# Patient Record
Sex: Male | Born: 1935 | Race: White | Hispanic: No | Marital: Married | Smoking: Former smoker
Health system: Southern US, Community
[De-identification: ages and names within clinical notes are randomized; demographics above are authoritative.]

## PROBLEM LIST (undated history)

## (undated) DIAGNOSIS — C649 Malignant neoplasm of unspecified kidney, except renal pelvis: Secondary | ICD-10-CM

## (undated) DIAGNOSIS — M5030 Other cervical disc degeneration, unspecified cervical region: Secondary | ICD-10-CM

## (undated) DIAGNOSIS — M5136 Other intervertebral disc degeneration, lumbar region: Secondary | ICD-10-CM

## (undated) DIAGNOSIS — F329 Major depressive disorder, single episode, unspecified: Secondary | ICD-10-CM

## (undated) DIAGNOSIS — M51369 Other intervertebral disc degeneration, lumbar region without mention of lumbar back pain or lower extremity pain: Secondary | ICD-10-CM

## (undated) DIAGNOSIS — F32A Depression, unspecified: Secondary | ICD-10-CM

## (undated) DIAGNOSIS — Z8739 Personal history of other diseases of the musculoskeletal system and connective tissue: Secondary | ICD-10-CM

## (undated) DIAGNOSIS — K219 Gastro-esophageal reflux disease without esophagitis: Secondary | ICD-10-CM

## (undated) DIAGNOSIS — I10 Essential (primary) hypertension: Secondary | ICD-10-CM

## (undated) DIAGNOSIS — R918 Other nonspecific abnormal finding of lung field: Secondary | ICD-10-CM

## (undated) DIAGNOSIS — G20 Parkinson's disease: Secondary | ICD-10-CM

## (undated) DIAGNOSIS — N289 Disorder of kidney and ureter, unspecified: Secondary | ICD-10-CM

## (undated) DIAGNOSIS — M1990 Unspecified osteoarthritis, unspecified site: Secondary | ICD-10-CM

## (undated) DIAGNOSIS — G20C Parkinsonism, unspecified: Secondary | ICD-10-CM

## (undated) DIAGNOSIS — N400 Enlarged prostate without lower urinary tract symptoms: Secondary | ICD-10-CM

## (undated) HISTORY — DX: Major depressive disorder, single episode, unspecified: F32.9

## (undated) HISTORY — DX: Essential (primary) hypertension: I10

## (undated) HISTORY — DX: Disorder of kidney and ureter, unspecified: N28.9

## (undated) HISTORY — DX: Parkinson's disease: G20

## (undated) HISTORY — DX: Malignant neoplasm of unspecified kidney, except renal pelvis: C64.9

## (undated) HISTORY — PX: NEPHRECTOMY: SHX65

## (undated) HISTORY — DX: Parkinsonism, unspecified: G20.C

## (undated) HISTORY — DX: Benign prostatic hyperplasia without lower urinary tract symptoms: N40.0

## (undated) HISTORY — DX: Gastro-esophageal reflux disease without esophagitis: K21.9

## (undated) HISTORY — DX: Other intervertebral disc degeneration, lumbar region without mention of lumbar back pain or lower extremity pain: M51.369

## (undated) HISTORY — DX: Unspecified osteoarthritis, unspecified site: M19.90

## (undated) HISTORY — DX: Other cervical disc degeneration, unspecified cervical region: M50.30

## (undated) HISTORY — DX: Other nonspecific abnormal finding of lung field: R91.8

## (undated) HISTORY — PX: CERVICAL LAMINECTOMY: SHX94

## (undated) HISTORY — PX: TOTAL HIP ARTHROPLASTY: SHX124

## (undated) HISTORY — DX: Personal history of other diseases of the musculoskeletal system and connective tissue: Z87.39

## (undated) HISTORY — DX: Depression, unspecified: F32.A

## (undated) HISTORY — DX: Other intervertebral disc degeneration, lumbar region: M51.36

---

## 1997-10-25 ENCOUNTER — Ambulatory Visit (HOSPITAL_COMMUNITY): Admission: RE | Admit: 1997-10-25 | Discharge: 1997-10-25 | Payer: Self-pay | Admitting: Neurosurgery

## 1997-11-04 ENCOUNTER — Ambulatory Visit (HOSPITAL_COMMUNITY): Admission: RE | Admit: 1997-11-04 | Discharge: 1997-11-04 | Payer: Self-pay | Admitting: Neurosurgery

## 1999-04-29 ENCOUNTER — Encounter: Payer: Self-pay | Admitting: Neurosurgery

## 1999-04-29 ENCOUNTER — Ambulatory Visit (HOSPITAL_COMMUNITY): Admission: RE | Admit: 1999-04-29 | Discharge: 1999-04-29 | Payer: Self-pay | Admitting: Neurosurgery

## 1999-06-12 ENCOUNTER — Encounter: Payer: Self-pay | Admitting: Orthopedic Surgery

## 1999-06-12 ENCOUNTER — Ambulatory Visit (HOSPITAL_COMMUNITY): Admission: RE | Admit: 1999-06-12 | Discharge: 1999-06-12 | Payer: Self-pay | Admitting: Orthopedic Surgery

## 2008-07-02 DIAGNOSIS — C649 Malignant neoplasm of unspecified kidney, except renal pelvis: Secondary | ICD-10-CM

## 2008-07-02 HISTORY — DX: Malignant neoplasm of unspecified kidney, except renal pelvis: C64.9

## 2009-06-20 ENCOUNTER — Encounter (INDEPENDENT_AMBULATORY_CARE_PROVIDER_SITE_OTHER): Payer: Self-pay | Admitting: Urology

## 2009-06-20 ENCOUNTER — Inpatient Hospital Stay (HOSPITAL_COMMUNITY): Admission: RE | Admit: 2009-06-20 | Discharge: 2009-06-23 | Payer: Self-pay | Admitting: Urology

## 2009-06-23 ENCOUNTER — Emergency Department (HOSPITAL_COMMUNITY): Admission: EM | Admit: 2009-06-23 | Discharge: 2009-06-24 | Payer: Self-pay | Admitting: Emergency Medicine

## 2010-10-02 LAB — BASIC METABOLIC PANEL
BUN: 24 mg/dL — ABNORMAL HIGH (ref 6–23)
CO2: 25 mEq/L (ref 19–32)
CO2: 29 mEq/L (ref 19–32)
Calcium: 8.3 mg/dL — ABNORMAL LOW (ref 8.4–10.5)
Calcium: 9.1 mg/dL (ref 8.4–10.5)
Chloride: 100 mEq/L (ref 96–112)
Chloride: 104 mEq/L (ref 96–112)
Chloride: 105 mEq/L (ref 96–112)
Chloride: 107 mEq/L (ref 96–112)
Creatinine, Ser: 1.21 mg/dL (ref 0.4–1.5)
Creatinine, Ser: 1.29 mg/dL (ref 0.4–1.5)
GFR calc Af Amer: 43 mL/min — ABNORMAL LOW (ref >60)
GFR calc Af Amer: 44 mL/min — ABNORMAL LOW (ref >60)
GFR calc Af Amer: 48 mL/min — ABNORMAL LOW (ref >60)
GFR calc Af Amer: >60 mL/min (ref >60)
GFR calc Af Amer: >60 mL/min (ref >60)
GFR calc non Af Amer: 35 mL/min — ABNORMAL LOW (ref >60)
GFR calc non Af Amer: 36 mL/min — ABNORMAL LOW (ref >60)
GFR calc non Af Amer: 58 mL/min — ABNORMAL LOW (ref >60)
Glucose, Bld: 99 mg/dL (ref 70–99)
Potassium: 3.8 mEq/L (ref 3.5–5.1)
Potassium: 4 mEq/L (ref 3.5–5.1)
Potassium: 4.2 mEq/L (ref 3.5–5.1)
Potassium: 4.3 mEq/L (ref 3.5–5.1)
Potassium: 4.8 mEq/L (ref 3.5–5.1)
Sodium: 133 mEq/L — ABNORMAL LOW (ref 135–145)
Sodium: 133 mEq/L — ABNORMAL LOW (ref 135–145)
Sodium: 136 mEq/L (ref 135–145)
Sodium: 141 mEq/L (ref 135–145)

## 2010-10-02 LAB — URINALYSIS, ROUTINE W REFLEX MICROSCOPIC
Leukocytes, UA: NEGATIVE
Protein, ur: NEGATIVE mg/dL
Specific Gravity, Urine: 1.008 (ref 1.005–1.030)
Urobilinogen, UA: 0.2 mg/dL (ref 0.0–1.0)

## 2010-10-02 LAB — CBC
HCT: 34.8 % — ABNORMAL LOW (ref 39.0–52.0)
Hemoglobin: 11.5 g/dL — ABNORMAL LOW (ref 13.0–17.0)
MCHC: 33 g/dL (ref 30.0–36.0)
MCV: 97.1 fL (ref 78.0–100.0)
Platelets: 213 10*3/uL (ref 150–400)
RBC: 3.58 MIL/uL — ABNORMAL LOW (ref 4.22–5.81)
RDW: 14 % (ref 11.5–15.5)
WBC: 7.3 10*3/uL (ref 4.0–10.5)

## 2010-10-02 LAB — POCT I-STAT, CHEM 8
BUN: 17 mg/dL (ref 6–23)
Creatinine, Ser: 2.1 mg/dL — ABNORMAL HIGH (ref 0.4–1.5)
Hemoglobin: 10.2 g/dL — ABNORMAL LOW (ref 13.0–17.0)
Potassium: 4 mEq/L (ref 3.5–5.1)
Sodium: 134 mEq/L — ABNORMAL LOW (ref 135–145)

## 2010-10-02 LAB — HEMOGLOBIN AND HEMATOCRIT, BLOOD
HCT: 30.8 % — ABNORMAL LOW (ref 39.0–52.0)
HCT: 31.5 % — ABNORMAL LOW (ref 39.0–52.0)
Hemoglobin: 10.4 g/dL — ABNORMAL LOW (ref 13.0–17.0)

## 2010-10-02 LAB — TYPE AND SCREEN
ABO/RH(D): O POS
Antibody Screen: NEGATIVE

## 2010-10-02 LAB — URINE MICROSCOPIC-ADD ON

## 2013-07-02 HISTORY — PX: CATARACT EXTRACTION, BILATERAL: SHX1313

## 2014-07-28 ENCOUNTER — Telehealth: Payer: Self-pay | Admitting: Oncology

## 2014-07-28 NOTE — Telephone Encounter (Signed)
EMAIL NP APPT DTR [facility] FOR 05/12 @ 4 W/DR. MAGRINAT.  MEDICAL RECORDS RECEIVED DROM [facility]

## 2014-09-29 ENCOUNTER — Encounter: Payer: Self-pay | Admitting: Neurology

## 2014-11-04 ENCOUNTER — Ambulatory Visit (INDEPENDENT_AMBULATORY_CARE_PROVIDER_SITE_OTHER): Payer: Medicare Other | Admitting: Neurology

## 2014-11-04 ENCOUNTER — Encounter: Payer: Self-pay | Admitting: Neurology

## 2014-11-04 VITALS — BP 118/58 | HR 77 | Wt 142.0 lb

## 2014-11-04 DIAGNOSIS — N289 Disorder of kidney and ureter, unspecified: Secondary | ICD-10-CM

## 2014-11-04 DIAGNOSIS — G249 Dystonia, unspecified: Secondary | ICD-10-CM | POA: Diagnosis not present

## 2014-11-04 DIAGNOSIS — K117 Disturbances of salivary secretion: Secondary | ICD-10-CM

## 2014-11-04 DIAGNOSIS — R911 Solitary pulmonary nodule: Secondary | ICD-10-CM

## 2014-11-04 DIAGNOSIS — F0280 Dementia in other diseases classified elsewhere without behavioral disturbance: Secondary | ICD-10-CM

## 2014-11-04 DIAGNOSIS — G20 Parkinson's disease: Secondary | ICD-10-CM

## 2014-11-04 NOTE — Progress Notes (Signed)
Richard Mcgee was seen today in the movement disorders clinic for neurologic consultation at the request of Dr. Darlen Round.  The consultation is for the evaluation of PD.  The records that were made available to me were reviewed.    Earliest records were from 2013, but it appears that the patient had seen him for longer than that.  This patient is accompanied in the office by his spouse and daughter in law who supplements the history.     The patient's first symptom that he can recall was right hand tremor.  Pts wife states he was initially dx with ET but he was later dx with PD.  They estimate that this was 5 years ago.  He was initially on no medication.  The patient has been on carbidopa/levodopa 25/100 since September, 2013.  In July, 2014 amantadine was added because of dyskinesia but by his follow-up visit in October, 2014 this medication had been discontinued.  Records do not make it clear why this was discontinued by the patient but the patients wife states that he had visual hallucinations.  In April 2015, Aricept was added but a subsequent note indicates that it was discontinued because of side effect, but does not indicate what the side effect was.  Pts wife/daughter in law states that he only took it once or twice but "he doesn't like to take medication."  [name] on carbidopa/levodopa 25/100, 2 at 6 am, 2 at noon, 2 at 5 pm.  He takes the noon dosage and the 5 pm dosage AFTER the meals.  Pt doesn't think that it helps but family reports that he tried to d/c a few weeks ago and sx's got worse (especially tremor).  The patient does not remember this.  The patient's wife does state that he was told by Dr. Darlen Round to take Benadryl as it could be helpful for Parkinson's disease.  The patient does not do this, but his wife wanted to know if it would be beneficial.  Specific Symptoms:  Tremor: Yes.   Voice: hypophonic Sleep:   Vivid Dreams:  No.  Acting out dreams:  Yes.   (has fallen out of bed and  broke clavicle last year) Wet Pillows: Yes.   Postural symptoms:  Yes.    Falls?  Yes.   (6 total falls since dx; last fall in middle of the night and fell in bathroom - over a month ago) Bradykinesia symptoms: slow movements and difficulty getting out of a chair (last therapy 07/2013) Loss of smell:  No. Loss of taste:  No. Urinary Incontinence:  Yes.   (but much better with nocturia now and not as many issues) Difficulty Swallowing:  No. (except very large pills) Handwriting, micrographia: Yes.   Trouble with ADL's:  Yes.   (has caregivers that help to bathe/dress - caregivers present x 3 years)  Trouble buttoning clothing: No. Depression:  Yes.   (to start Paxil XR) Memory changes:  Yes.   (drivers license expired on 5/68 and when went to renew license, the [facility] would not allow it; memory change worse if dehydrated; has a pill box and wife fills that) Hallucinations:  Yes.    visual distortions: Yes.   N/V:  Yes.   Lightheaded:  Yes.   (not often)  Syncope: No. Diplopia:  No. Dyskinesia:  Yes.   (hips will "gyrate"); has "tongue movement"  Neuroimaging has previously been performed.  Wife believes it was a CT brain done about 5 years ago.  It is not available for my review today.  PREVIOUS MEDICATIONS: Sinemet and Amantadine (hallucinations), given Aricept but did not take it because he does not "like pills."  ALLERGIES:   Allergies  Allergen Reactions  . Sulfa Antibiotics     CURRENT MEDICATIONS:  Outpatient Encounter Prescriptions as of 11/04/2014  Medication Sig  . acetaminophen-codeine (TYLENOL #3) 300-30 MG per tablet 1 tablet.  . carbidopa-levodopa (SINEMET IR) 25-100 MG per tablet Take 1 tablet by mouth 3 (three) times daily.  . Cholecalciferol 2000 UNITS CAPS Take 2,000 Units by mouth daily.  Marland Kitchen docusate sodium (COLACE) 50 MG capsule Take 50 mg by mouth 2 (two) times daily.  . Misc Natural Products (FIBER 7) POWD Take by mouth.  . Multiple Vitamin (MULTIVITAMIN) tablet  Take 1 tablet by mouth daily.  Marland Kitchen PARoxetine (PAXIL-CR) 12.5 MG 24 hr tablet Take 12.5 mg by mouth daily.  . polyethylene glycol (MIRALAX / GLYCOLAX) packet Take 17 g by mouth daily.  . [DISCONTINUED] amlodipine-benazepril (LOTREL) 2.5-10 MG per capsule Take 1 capsule by mouth daily.  . [DISCONTINUED] fluticasone (VERAMYST) 27.5 MCG/SPRAY nasal spray Place 2 sprays into the nose daily.  . [DISCONTINUED] omeprazole (PRILOSEC) 40 MG capsule Take 40 mg by mouth daily.  . [DISCONTINUED] cholecalciferol (VITAMIN D) 1000 UNITS tablet Take 2,000 Units by mouth daily.   No facility-administered encounter medications on file as of 11/04/2014.    PAST MEDICAL HISTORY:   Past Medical History  Diagnosis Date  . Hypertension   . GERD (gastroesophageal reflux disease)   . Parkinsonism   . Renal cell cancer 2010  . Degenerative disc disease, cervical   . Degenerative disc disease, lumbar   . Osteoarthritis   . Depression   . H/O rotator cuff tear   . BPH (benign prostatic hyperplasia)   . Renal insufficiency   . Lung nodules     PAST SURGICAL HISTORY:   Past Surgical History  Procedure Laterality Date  . Nephrectomy Right   . Total hip arthroplasty Bilateral   . Cervical laminectomy    . Cataract extraction, bilateral  2015    SOCIAL HISTORY:   History   Social History  . Marital Status: Married    Spouse Name: N/A  . Number of Children: N/A  . Years of Education: N/A   Occupational History  . 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 Smoking status: Former Research scientist (life sciences)  . Smokeless tobacco: Never Used     Comment: quit 1970's  . Alcohol Use: 0.0 oz/week    0 Standard drinks or equivalent per week     Comment: 1 time every 4 weeks  . Drug Use: No  . Sexual Activity: Not on file   Other Topics Concern  . Not on file   Social History Narrative   Lives with wife in a one story home.  Has 2  children.  Retired from [employer].      FAMILY HISTORY:   Family Status  Relation Status Death Age  . Mother Deceased     CAD, DM, CHF  . Father Deceased     coal mine accident  . Brother Deceased     CA, renal cell  . Sister Deceased     liver CA  . Child Alive     2, alive and well    ROS:  A complete 10 system review of systems was  obtained and was unremarkable apart from what is mentioned above.  The patient was seen in the office at approximately 10 AM and had last taken his medication at 6 AM  PHYSICAL EXAMINATION:    VITALS:   Filed Vitals:   11/04/14 0918  BP: 118/58  Pulse: 77  Weight: 142 lb (64.411 kg)  SpO2: 96%    GEN:  The patient appears stated age and is in NAD. HEENT:  Normocephalic, atraumatic.  The mucous membranes are moist. The superficial temporal arteries are without ropiness or tenderness. CV:  RRR Lungs:  CTAB Neck/HEME:  There are no carotid bruits bilaterally.  Neurological examination:  Orientation: The patient is alert and oriented to person and place, but had some difficulty with specifics on time.  He had difficulty with his history and required assistance from his daughter-in-law and wife. Sensation: Sensation is intact to light and pinprick throughout (facial, trunk, extremities). Vibration is decreased at the bilateral big toe, left greater than right. There is no extinction with double simultaneous stimulation. There is no sensory dermatomal level identified. Motor: Strength is 5/5 in the bilateral upper and lower extremities.   Shoulder shrug is equal and symmetric.  There is no pronator drift. Deep tendon reflexes: Deep tendon reflexes are 2/4 at the bilateral biceps, triceps, brachioradialis, patella and absent at the bilateral achilles. Plantar responses are downgoing bilaterally.  Movement examination: Tone: There is normal tone in the bilateral upper extremities.  The tone in the lower extremities is increased, although a  component of gegenhalten is likely present.  Abnormal movements: There is moderate to severe resting tremor bilaterally, but it is more persistent on the right than the left. Coordination:  There is  decremation with RAM's, seen with finger taps and alternating supination/pronation of the forearm. Gait and Station: The patient has significant difficulty arising out of a deep-seated chair without the use of the hands and is unable to do this at all.  He required assistance to get out of the chair.  He required assistance to walk down the hall, with the examiner support.  He froze when attempting turns and through doorways thresholds.  It is the right foot that tends to stick to the ground.  The patient's stride length is markedly decreased.    ASSESSMENT/PLAN:  1.  Idiopathic Parkinson's disease, diagnosed since approximately 2011 and starting on levodopa in September, 2013.  -The patient's disease course has been complicated by hallucinations, cognitive impairment and postural instability.  -The patient is not convinced that his medication necessarily helps, but his family thinks that it does.  He is currently on carbidopa/levodopa 25/100, 2 tablets 3 times per day.  After some discussion, we decided to do an on/off test.  I suspicion is that he may have levodopa resisted tremor, but that the levodopa likely helps rigidity, but the on/off test will help [place] decipher this.  -I talked to the patient and his family about the interaction between levodopa and protein and asked him to move his levodopa to at least 30 minutes before the meals, as opposed after the meals.  I may need to increase the number of pills that he takes, but wanted to wait until the on/off test.  Compliance has been somewhat of an issue with medication.  -Explained to them that I do not think Benadryl would be a good idea, as it can increase risk for falls and cognitive decline in this state of the disease.  -Talked to him about safety  and  community resources.  Much greater than 50% of this 65 minute visit was spent in counseling.  We talked about the [employer] gym, which I highly recommended for him.  We talked about the support group in [place] that meets once a month.  -I am going to refer him to the neuro rehabilitation center for PT/OT/ST. 2.  Sialorrhea  -Overall mild and while we discussed Myobloc, they are not interested right now. 3.  Parkinson's dementia  -He was on Aricept but has discontinued it.  He does not like to take medications.  Fortunately, he does have caregivers and safety has been addressed in the home. 4.  Renal insufficiency  -His daughter-in-law states that his last creatinine was 2.1.  This is being monitored by his PCP and urology. 5.  Lung nodule  -Per family, it is felt that this is likely a metastatic lesion from renal cell carcinoma and this is just being followed.  They state that it has been fairly stable. 6.  Dyskinesia  -Did not see any today.  Tried amantadine in the past but increased hallucinations.

## 2014-11-04 NOTE — Patient Instructions (Signed)
1. We have you scheduled for your on/off test on 11/12/2014 at 2:30 pm. Please arrive 15 minutes early. Please do not take your Parkinson's medications on this date. Take your last dose on 11/11/2014 (the evening dose). 2. You have been referred to Neuro Rehab. They will call you directly to schedule an appointment.  Please call [phone] if you do not hear from them.

## 2014-11-11 ENCOUNTER — Ambulatory Visit (HOSPITAL_COMMUNITY)
Admission: RE | Admit: 2014-11-11 | Discharge: 2014-11-11 | Disposition: A | Discharge status: Home / Self Care | Payer: Medicare Other | Source: Ambulatory Visit | Attending: Oncology | Admitting: Oncology

## 2014-11-11 ENCOUNTER — Ambulatory Visit: Payer: Medicare Other

## 2014-11-11 ENCOUNTER — Encounter: Payer: Self-pay | Admitting: Oncology

## 2014-11-11 ENCOUNTER — Ambulatory Visit (HOSPITAL_BASED_OUTPATIENT_CLINIC_OR_DEPARTMENT_OTHER): Payer: Medicare Other | Admitting: Oncology

## 2014-11-11 ENCOUNTER — Telehealth: Payer: Self-pay | Admitting: Neurology

## 2014-11-11 VITALS — BP 134/63 | HR 64 | Temp 97.5°F | Resp 18 | Ht 66.0 in | Wt 141.9 lb

## 2014-11-11 DIAGNOSIS — C7800 Secondary malignant neoplasm of unspecified lung: Secondary | ICD-10-CM

## 2014-11-11 DIAGNOSIS — C799 Secondary malignant neoplasm of unspecified site: Secondary | ICD-10-CM

## 2014-11-11 DIAGNOSIS — C641 Malignant neoplasm of right kidney, except renal pelvis: Secondary | ICD-10-CM | POA: Diagnosis present

## 2014-11-11 DIAGNOSIS — N183 Chronic kidney disease, stage 3 unspecified: Secondary | ICD-10-CM | POA: Insufficient documentation

## 2014-11-11 DIAGNOSIS — G20A1 Parkinson's disease without dyskinesia, without mention of fluctuations: Secondary | ICD-10-CM | POA: Insufficient documentation

## 2014-11-11 DIAGNOSIS — G20 Parkinson's disease: Secondary | ICD-10-CM

## 2014-11-11 DIAGNOSIS — C7970 Secondary malignant neoplasm of unspecified adrenal gland: Secondary | ICD-10-CM | POA: Diagnosis not present

## 2014-11-11 DIAGNOSIS — Z85528 Personal history of other malignant neoplasm of kidney: Secondary | ICD-10-CM | POA: Diagnosis not present

## 2014-11-11 NOTE — Telephone Encounter (Signed)
-----   Message from [place], [place] sent at 11/11/2014  2:17 PM EDT ----- Patients daughter called she was told that his appointment had been moved from 2:15 to 11:15 may not have care givers to come with at this time she would also like to follow up on a referral for OT and PT they have not received word on this

## 2014-11-11 NOTE — Progress Notes (Signed)
Checked in new pt with no financial concerns. °

## 2014-11-11 NOTE — Telephone Encounter (Signed)
Spoke with patient's daughter and they have worked out coming early for his appt. Called neuro rehab at [phone] to check on referral. They have received referral and will call patient with an appt.

## 2014-11-11 NOTE — Progress Notes (Signed)
[facility]  [phone] [phone]     ID: Richard Mcgee DOB: July 11, 1927  MR#: [id number]  XFG#:[id number]  Patient Care Team: Lajean Manes, MD as PCP - General (Internal Medicine) Raynelle Bring, MD as Consulting Physician (Urology) Chauncey Cruel, MD as Consulting Physician (Oncology) Ludwig Clarks, DO as Consulting Physician (Neurology) PCP: Mathews Argyle, MD OTHER MD:  CHIEF COMPLAINT: stage 4 clear cell renal carcinoma  CURRENT TREATMENT: observation   HISTORY OF PRESENT ILLNESS: Richard Mcgee") has a history of renal cell carcinoma dating back to 06/20/2009 when he underwent right nephrectomy under Dr. Alinda Money 4 clear cell renal carcinoma, grade 2, measuring 9 cm. The tumor was confined to the kidney and margins were negative SZB ( 2010-385). I do not have copies of any staging studies obtained at that time, but in September 2012 the patient was found to have bilateral lung lesions and a new 1.5 cm( right) adrenal mass. Treatment was discussed with the patient, but he chose observation, and over the last several years the largest measurable nodule, in the left lower lung, has increased there is slowly. By 07/28/2013 on CT scan it measured 2.2 cm. In August 2015 by chest x-ray it measured 3.2 cm, and when rechecked 08/21/2014 again by chest x-ray it had not changed at all, again measuring 3.2 cm.  His subsequent history is as detailed below.  INTERVAL HISTORY: Richard Mcgee was evaluated in the cancer clinic 11/11/2014 accompanied by her son Richard Mcgee. Richard Mcgee and his wife Richard Mcgee have moved to [place] recently to be closer to their 2 sons and grandchildren. He has established himself with a primary care physician, urologist, and neurologist.  REVIEW OF SYSTEMS: Antino has significant resting tremor and a shuffling gait consistent with his diagnosis of parkinsonism. This limits him. For instance he can no longer play the [place]. He used  to be an avid runner, skier, and Firefighter. He is now pretty much confined to the home, although he has been started on a special program of physical therapy for Parkinson's patients and he is enjoying that. He does have some pain in his lower back, hips and feet, and has a significant history of degenerative disc diseaseand spinal stenosis.Marland Kitchen He is status post bilateral hip replacement. He has had cervical laminectomy. He has also had some falls and is status post left clavicular fracture remotely. Sometimes his vision is a little bit blurred. He denies dizziness, nausea, or vomiting problems. He has a cough which is occasionally productive of white phlegm. There has been no hemoptysis and he denies shortness of breath or pleurisy. He has some stress urinary incontinence. He has a history of bladder infections and hematuria. He feels forgetful and somewhat depressed. A detailed review of systems today was otherwise noncontributory  PAST MEDICAL HISTORY: Past Medical History  Diagnosis Date  . Hypertension   . GERD (gastroesophageal reflux disease)   . Parkinsonism   . Renal cell cancer 2010  . Degenerative disc disease, cervical   . Degenerative disc disease, lumbar   . Osteoarthritis   . Depression   . H/O rotator cuff tear   . BPH (benign prostatic hyperplasia)   . Renal insufficiency   . Lung nodules     PAST SURGICAL HISTORY: Past Surgical History  Procedure Laterality Date  . Nephrectomy Right   . Total hip arthroplasty Bilateral   . Cervical laminectomy    . Cataract extraction, bilateral  2015    FAMILY HISTORY Family History  Problem Relation Age of Onset  . CAD    . Hypertension    . CVA    . Diabetes    . Kidney cancer    . Depression    the patient's father died at the age of 54, 32 years after minding accident. The patient's mother died at the age of 47 from complications of diabetes. The patient had one brother, who had kidney cancer as well as prostate cancer. He  had one sister who had liver cancer. Both of them were diagnosed in their 62s. The patient has a nephew diagnosed with kidney cancer in his 24s, and a cousin and A maternalaunt both diagnosed with kidney cancer in their 33s  SOCIAL HISTORY:  [name] used to work in [employer], and he was in [employer] when he met his wife of 60+ years, [name]. He was playing the plan at the [employer] and saw her and decided he was going to [place] her.Their 2 sons are [name], who is a Conservation officer, historic buildings here in [place], and [name], who is an attorney here in [place]. The patient has 2 grandchildren. He is not a church attender    ADVANCED DIRECTIVES: living Will is in place. The patient's wife is his healthcare power of attorney   HEALTH MAINTENANCE: History  Substance Use Topics  . Smoking status: Former Research scientist (life sciences)  . Smokeless tobacco: Never Used     Comment: quit 1970's  . Alcohol Use: 0.0 oz/week    0 Standard drinks or equivalent per week     Comment: 1 time every 4 weeks     Colonoscopy:  PSA:  Bone density:  Lipid panel:  Allergies  Allergen Reactions  . Sulfa Antibiotics     Current Outpatient Prescriptions  Medication Sig Dispense Refill  . acetaminophen-codeine (TYLENOL #3) 300-30 MG per tablet 1 tablet.    . carbidopa-levodopa (SINEMET IR) 25-100 MG per tablet Take 1 tablet by mouth 3 (three) times daily.    . Cholecalciferol 2000 UNITS CAPS Take 2,000 Units by mouth daily.    Marland Kitchen docusate sodium (COLACE) 50 MG capsule Take 50 mg by mouth 2 (two) times daily.    . Misc Natural Products (FIBER 7) POWD Take by mouth.    . Multiple Vitamin (MULTIVITAMIN) tablet Take 1 tablet by mouth daily.    Marland Kitchen PARoxetine (PAXIL-CR) 12.5 MG 24 hr tablet Take 12.5 mg by mouth daily.    . polyethylene glycol (MIRALAX / GLYCOLAX) packet Take 17 g by mouth daily.     No current facility-administered medications for this visit.    OBJECTIVE: older white man who appears younger than stated  age 81 Vitals:   11/11/14 1612  BP: 134/63  Pulse: 64  Temp: 97.5 F (36.4 C)  Resp: 18     Body mass index is 22.91 kg/(m^2).    ECOG FS:2 - Symptomatic, <50% confined to bed  Ocular: Sclerae unicteric, EOMs intact, pupils round and equal, Ear-nose-throat: Oropharynx clearand moist Lymphatic: No cervical or supraclavicular adenopathy Lungs no rales or rhonchi, fair excursion bilaterally Heart regular rate and rhythm, 2/6 mid systolic murmur, chronic Abd soft, nontender, positive bowel sounds, no masses palpated MSK no focal spinal tenderness, no joint edema Neuro: marked resting tremor, shuffling gait, minimal facial rigidity, well-oriented, positive affect    LAB RESULTS:  CMP     Component Value Date/Time   NA 134* 06/23/2009 2309   K 4.0 06/23/2009 2309   CL 104 06/23/2009 2309   CO2 26  06/23/2009 0450   GLUCOSE 102* 06/23/2009 2309   BUN 17 06/23/2009 2309   CREATININE 2.1* 06/23/2009 2309   CALCIUM 8.3* 06/23/2009 0450   GFRNONAA 35* 06/23/2009 0450   GFRAA * 06/23/2009 0450    43        The eGFR has been calculated using the MDRD equation. This calculation has not been validated in all clinical situations. eGFR's persistently <60 mL/min signify possible Chronic Kidney Disease.    INo results found for: SPEP, UPEP  Lab Results  Component Value Date   WBC 7.3 06/17/2009   HGB 10.2* 06/23/2009   HCT 30.0* 06/23/2009   MCV 97.1 06/17/2009   PLT 213 06/17/2009      Chemistry      Component Value Date/Time   NA 134* 06/23/2009 2309   K 4.0 06/23/2009 2309   CL 104 06/23/2009 2309   CO2 26 06/23/2009 0450   BUN 17 06/23/2009 2309   CREATININE 2.1* 06/23/2009 2309      Component Value Date/Time   CALCIUM 8.3* 06/23/2009 0450       No results found for: LABCA2  No components found for: [id number]  No results for input(s): INR in the last 168 hours.  Urinalysis    Component Value Date/Time   COLORURINE YELLOW 06/23/2009 2313    APPEARANCEUR CLEAR 06/23/2009 2313   LABSPEC 1.008 06/23/2009 2313   PHURINE 7.0 06/23/2009 2313   GLUCOSEU NEGATIVE 06/23/2009 2313   HGBUR TRACE* 06/23/2009 2313   BILIRUBINUR NEGATIVE 06/23/2009 2313   KETONESUR NEGATIVE 06/23/2009 2313   PROTEINUR NEGATIVE 06/23/2009 2313   UROBILINOGEN 0.2 06/23/2009 2313   NITRITE NEGATIVE 06/23/2009 2313   LEUKOCYTESUR NEGATIVE 06/23/2009 2313    STUDIES: Chest x-ray today pending  ASSESSMENT: 81 y.o. [place] man s/p Right nephrectomy 06/20/2009 for a T2a N0 MX, clear cell renal carcinoma  (1) progression in multiple lung lesions and a new adrenal lesion noted September 2012, followed off treatment since  (2) Parkinson's disease  (3) DDD with history of compression fractures but negative lumbar spine MRI 11/18/2012  (a) s/p cervical laminectomy  (b) s/p bilateral THR  (c) Hx trauma to Left clavicle (fall)  (d) spinal stenosis  (4) CKD stage 3  PLAN: I spent approximately an hour today with Richard Mcgee and his son Richard Mcgee going over his situation. We reviewed his diagnosis, treatment history, and prognosis. He is now 5-1/2 years out from his definitive surgery, with known metastatic disease, which however is growing very slowly.  The patient understands that we would not be able to cure his cancer even if we initiated aggressive treatment. The goal of treatment, if we started, would be control. The treatment we would start would be sunitinib. Today we discussed some of the mechanics on how to take this medication and also some of the possible toxicities, side effects and complications. This information was given to Richard Mcgee  in writing.  What he would like to do is keep an eye on things. If things start to "get out of hand", we could consider Sutent at that time. On the other hand if we wait "too long", he might not be able to tolerate that medication. In that case we would go straight to hospice, and the goal then would be not control of  the tumor but keeping him as fit as possible as long as possible and always comfortable.  It helps that Richard Mcgee and his wife have home instead caregivers daily. He is receiving excellent  care from his other physicians. Accordingly at this point my role is limited. We will obtain a chest x-ray today and one again in November, when he will return to see me. If we ever decide to try treatment, I would manage that. Otherwise I would be glad to participate in his hospice care if I when the time comes for that  The patient has a good understanding of the overall plan. he agrees with it. He or his familywill call with any problems that may develop before his next visit here.  Chauncey Cruel, MD   11/11/2014 5:34 PM Medical Oncology and Hematology [facility] [address] [place], [place] [address] Tel. [phone]    Fax. [phone]

## 2014-11-12 ENCOUNTER — Telehealth: Payer: Self-pay | Admitting: Neurology

## 2014-11-12 ENCOUNTER — Ambulatory Visit (INDEPENDENT_AMBULATORY_CARE_PROVIDER_SITE_OTHER): Payer: Medicare Other | Admitting: Neurology

## 2014-11-12 ENCOUNTER — Encounter: Payer: Self-pay | Admitting: Neurology

## 2014-11-12 VITALS — BP 156/70 | HR 68 | Ht 66.0 in | Wt 143.0 lb

## 2014-11-12 DIAGNOSIS — C641 Malignant neoplasm of right kidney, except renal pelvis: Secondary | ICD-10-CM | POA: Diagnosis not present

## 2014-11-12 DIAGNOSIS — C799 Secondary malignant neoplasm of unspecified site: Secondary | ICD-10-CM

## 2014-11-12 DIAGNOSIS — R441 Visual hallucinations: Secondary | ICD-10-CM | POA: Diagnosis not present

## 2014-11-12 DIAGNOSIS — F0280 Dementia in other diseases classified elsewhere without behavioral disturbance: Secondary | ICD-10-CM

## 2014-11-12 DIAGNOSIS — G20 Parkinson's disease: Secondary | ICD-10-CM

## 2014-11-12 DIAGNOSIS — G249 Dystonia, unspecified: Secondary | ICD-10-CM

## 2014-11-12 MED ORDER — CARBIDOPA-LEVODOPA 25-100 MG PO TABS
3.0000 | ORAL_TABLET | Freq: Once | ORAL | Status: AC
  Administered 2014-11-12: 3 via ORAL

## 2014-11-12 MED ORDER — CARBIDOPA-LEVODOPA 25-100 MG PO TABS: 2.0000 | ORAL_TABLET | Freq: Four times a day (QID) | ORAL | Status: DC

## 2014-11-12 NOTE — Progress Notes (Signed)
Richard Mcgee was seen today in the movement disorders clinic for neurologic consultation at the request of Dr. Darlen Round.  The consultation is for the evaluation of PD.  The records that were made available to me were reviewed.    Earliest records were from 2013, but it appears that the patient had seen him for longer than that.  This patient is accompanied in the office by his spouse and daughter in law who supplements the history.     The patient's first symptom that he can recall was right hand tremor.  Pts wife states he was initially dx with ET but he was later dx with PD.  They estimate that this was 5 years ago.  He was initially on no medication.  The patient has been on carbidopa/levodopa 25/100 since September, 2013.  In July, 2014 amantadine was added because of dyskinesia but by his follow-up visit in October, 2014 this medication had been discontinued.  Records do not make it clear why this was discontinued by the patient but the patients wife states that he had visual hallucinations.  In April 2015, Aricept was added but a subsequent note indicates that it was discontinued because of side effect, but does not indicate what the side effect was.  Pts wife/daughter in law states that he only took it once or twice but "he doesn't like to take medication."  [name] on carbidopa/levodopa 25/100, 2 at 6 am, 2 at noon, 2 at 5 pm.  He takes the noon dosage and the 5 pm dosage AFTER the meals.  Pt doesn't think that it helps but family reports that he tried to d/c a few weeks ago and sx's got worse (especially tremor).  The patient does not remember this.  The patient's wife does state that he was told by Dr. Darlen Round to take Benadryl as it could be helpful for Parkinson's disease.  The patient does not do this, but his wife wanted to know if it would be beneficial.  11/12/14 update:  The patient presents today for his on/off test.  He is accompanied by his daughter-in-law who supplements the history.  Since  our last visit, the patient has attended the [facility] gym and really has enjoyed it.  He is scheduled to start therapy on June 7.  He has not taken any of his levodopa since approximately 5:30 PM last night.  He has done worse this morning, but attributes that to the fact that he exercised vigorously and felt better yesterday.  He is currently generally taking his levodopa at 6 AM and then he will go back to sleep for 2 hours before he gets up for the day.  He then will take his next dose of medication at approximately noon and his last dose of medication at 5 PM.  Neuroimaging has previously been performed.  Wife believes it was a CT brain done about 5 years ago.   It is not available for my review today.  PREVIOUS MEDICATIONS: Sinemet and Amantadine (hallucinations), given Aricept but did not take it because he does not "like pills."  ALLERGIES:   Allergies  Allergen Reactions  . Sulfa Antibiotics     CURRENT MEDICATIONS:  Outpatient Encounter Prescriptions as of 11/12/2014  Medication Sig  . acetaminophen-codeine (TYLENOL #3) 300-30 MG per tablet 1 tablet.  . carbidopa-levodopa (SINEMET IR) 25-100 MG per tablet Take 1 tablet by mouth 3 (three) times daily.  . Cholecalciferol 2000 UNITS CAPS Take 2,000 Units by mouth daily.  Marland Kitchen  docusate sodium (COLACE) 50 MG capsule Take 50 mg by mouth 2 (two) times daily.  . Misc Natural Products (FIBER 7) POWD Take by mouth.  . Multiple Vitamin (MULTIVITAMIN) tablet Take 1 tablet by mouth daily.  Marland Kitchen PARoxetine (PAXIL-CR) 12.5 MG 24 hr tablet Take 12.5 mg by mouth daily.  . polyethylene glycol (MIRALAX / GLYCOLAX) packet Take 17 g by mouth daily.   No facility-administered encounter medications on file as of 11/12/2014.    PAST MEDICAL HISTORY:   Past Medical History  Diagnosis Date  . Hypertension   . GERD (gastroesophageal reflux disease)   . Parkinsonism   . Renal cell cancer 2010  . Degenerative disc disease, cervical   . Degenerative disc disease,  lumbar   . Osteoarthritis   . Depression   . H/O rotator cuff tear   . BPH (benign prostatic hyperplasia)   . Renal insufficiency   . Lung nodules     PAST SURGICAL HISTORY:   Past Surgical History  Procedure Laterality Date  . Nephrectomy Right   . Total hip arthroplasty Bilateral   . Cervical laminectomy    . Cataract extraction, bilateral  2015    SOCIAL HISTORY:   History   Social History  . Marital Status: Married    Spouse Name: N/A  . Number of Children: N/A  . Years of Education: N/A   Occupational History  . 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 Smoking status: Former Research scientist (life sciences)  . Smokeless tobacco: Never Used     Comment: quit 1970's  . Alcohol Use: 0.0 oz/week    0 Standard drinks or equivalent per week     Comment: 1 time every 4 weeks  . Drug Use: No  . Sexual Activity: Not on file   Other Topics Concern  . Not on file   Social History Narrative   Lives with wife in a one story home.  Has 2 children.  Retired from [employer].      FAMILY HISTORY:   Family Status  Relation Status Death Age  . Mother Deceased     CAD, DM, CHF  . Father Deceased     coal mine accident  . Brother Deceased     CA, renal cell  . Sister Deceased     liver CA  . Child Alive     2, alive and well    ROS:  A complete 10 system review of systems was obtained and was unremarkable apart from what is mentioned above.  The patient was seen in the office at approximately 10 AM and had last taken his medication at 6 AM  PHYSICAL EXAMINATION:    VITALS:   Filed Vitals:   11/12/14 1109  BP: 156/70  Pulse: 68  Height: 5\' 6"  (1.676 m)  Weight: 143 lb (64.864 kg)    GEN:  The patient appears stated age and is in NAD. HEENT:  Normocephalic, atraumatic.  The mucous membranes are moist. The superficial temporal arteries are without ropiness or tenderness. CV:   RRR Lungs:  CTAB Neck/HEME:  There are no carotid bruits bilaterally.  Neurological examination:  Orientation: The patient is alert and oriented to person and place, but had some difficulty with specifics on time.  He had difficulty with his history and required assistance from his daughter-in-law and wife. Sensation: Sensation is intact to light and pinprick throughout (facial,  trunk, extremities). Vibration is decreased at the bilateral big toe, left greater than right. There is no extinction with double simultaneous stimulation. There is no sensory dermatomal level identified. Motor: Strength is 5/5 in the bilateral upper and lower extremities.   Shoulder shrug is equal and symmetric.  There is no pronator drift. Deep tendon reflexes: Deep tendon reflexes are 2/4 at the bilateral biceps, triceps, brachioradialis, patella and absent at the bilateral achilles. Plantar responses are downgoing bilaterally.  Movement examination: A complete UPDRS motor on/off test was performed today.  This is documented on a separate neurophysiologic worksheet.  UPDRS motor off score was 44 and UPDRS motor on score was 32.  In brief, the patients tremor was levodopa nonresponsive, but the rigidity especially in the left upper extremity was levodopa responsive.  His gait did not particularly change with levodopa.  ASSESSMENT/PLAN:  1.  Idiopathic Parkinson's disease, diagnosed since approximately 2011 and starting on levodopa in September, 2013.  -The patient's disease course has been complicated by hallucinations, cognitive impairment and postural instability.  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  I did give him more levodopa than he generally gets at home and the patient would like to try an increased dose.  He is currently  taking his 2 tablets at 6 AM/12 PM/5 PM and so we decided to instead change it so he takes 2 tablets at 6 AM/10 AM/2 PM/6 PM.  He will let me know or his family will let me know if this causes an increase in hallucinations.  -I talked to the patient and his family again about the interaction between levodopa and protein and asked him to move his levodopa to at least 30 minutes before the meals.  He does have caregivers now.  -Explained to them that I do not think Benadryl would be a good idea, as it can increase risk for falls and cognitive decline in this state of the disease.  -I am happy to report that he is now attending the [facility] gym and I encouraged him to continue this, as compliance has been somewhat of an issue with the past with medications and programs.  He has a referral to the neuro rehabilitation center for PT/OT/ST and is scheduled to start on June 7. 2.  Sialorrhea  -Overall mild and while we discussed My[facility], they are not interested right now. 3.  Parkinson's dementia  -He was on Aricept but has discontinued it.  He does not like to take medications.  Fortunately, he does have caregivers and safety has been addressed in the home. 4.  Renal insufficiency  -His daughter-in-law states that his last creatinine was 2.1.  This is being monitored by his PCP and urology. 5.  Lung nodule  -I received and appreciate a note from his oncologist today.  He has metastatic renal cell that has been fairly slow to progress and they are just watching this. 6.  Dyskinesia  -Did not see any today.  Tried amantadine in the past but increased hallucinations.  This may become more evident since we increased his levodopa and we will need to watch for this. 7.  Return in about 3 months (around 02/12/2015).  Spent 60 min face to face with the patient with greater than 50% in counseling and coordinating care

## 2014-11-12 NOTE — Telephone Encounter (Signed)
Pt states that the [facility] phone number is [phone]

## 2014-11-12 NOTE — Patient Instructions (Signed)
1. Increase Carbidopa Levodopa 25/100 to 2 tablets at 6 am, 10 am, 2 pm and 6 pm. Prescription given.

## 2014-11-12 NOTE — Telephone Encounter (Signed)
RX sent to [facility] per their request.

## 2014-11-22 ENCOUNTER — Other Ambulatory Visit: Payer: Self-pay | Admitting: Oncology

## 2014-11-22 DIAGNOSIS — R918 Other nonspecific abnormal finding of lung field: Secondary | ICD-10-CM

## 2014-11-23 ENCOUNTER — Telehealth: Payer: Self-pay | Admitting: Oncology

## 2014-11-23 NOTE — Telephone Encounter (Signed)
s.w. pt wife and advised on OCT and NOV appt....pt ok and aware

## 2014-11-26 ENCOUNTER — Other Ambulatory Visit: Payer: Self-pay | Admitting: Oncology

## 2014-12-07 ENCOUNTER — Ambulatory Visit: Payer: Medicare Other | Attending: Neurology | Admitting: Physical Therapy

## 2014-12-07 DIAGNOSIS — R490 Dysphonia: Secondary | ICD-10-CM | POA: Diagnosis present

## 2014-12-07 DIAGNOSIS — R258 Other abnormal involuntary movements: Secondary | ICD-10-CM

## 2014-12-07 DIAGNOSIS — R293 Abnormal posture: Secondary | ICD-10-CM

## 2014-12-07 DIAGNOSIS — R2689 Other abnormalities of gait and mobility: Secondary | ICD-10-CM

## 2014-12-07 DIAGNOSIS — R269 Unspecified abnormalities of gait and mobility: Secondary | ICD-10-CM

## 2014-12-07 NOTE — Therapy (Signed)
[facility] [address] [place], [place], [address] Phone: [phone]   Fax:  [phone]  Physical Therapy Evaluation  Patient Details  Name: Rhea Thrun MRN: [id number] Date of Birth: 03-Apr-1928 Referring Provider:  Ludwig Clarks, DO  Encounter Date: 12/07/2014      PT End of Session - 12/07/14 1310    Visit Number 1   Number of Visits 17  eval + 16 visits   Date for PT Re-Evaluation 02/05/15   Authorization Type Medicare/Tricare-g-code every 10th visit   PT Start Time 1151   PT Stop Time 1236   PT Time Calculation (min) 45 min   Equipment Utilized During Treatment Gait belt   Activity Tolerance Patient tolerated treatment well   Behavior During Therapy [facility] for tasks assessed/performed      Past Medical History  Diagnosis Date  . Hypertension   . GERD (gastroesophageal reflux disease)   . Parkinsonism   . Renal cell cancer 2010  . Degenerative disc disease, cervical   . Degenerative disc disease, lumbar   . Osteoarthritis   . Depression   . H/O rotator cuff tear   . BPH (benign prostatic hyperplasia)   . Renal insufficiency   . Lung nodules     Past Surgical History  Procedure Laterality Date  . Nephrectomy Right   . Total hip arthroplasty Bilateral   . Cervical laminectomy    . Cataract extraction, bilateral  2015    There were no vitals filed for this visit.  Visit Diagnosis:  Abnormality of gait  Festinating gait  Postural instability  Bradykinesia      Subjective Assessment - 12/07/14 1155    Subjective [name] an 81 year old male who presents to OP PT with history of Parkinson's disease for at least 5 years.  He notes bilateral UE tremors, and has freezing episodes with gait with doorways, turns and crowded spaces.  He has had at least 3 falls in the past 6 months.  He uses 4-wheeled RW.  He uses cane at times when caregivers are present.   Patient is accompained by: Family member   wife and daughter-in-law   Patient Stated Goals Pt's goal for therapy is to decrease tremor in his hands.   Currently in Pain? Yes   Pain Score --  more numbness than pain   Pain Location Back  and legs   Pain Orientation Lower   Pain Descriptors / Indicators Aching   Pain Type Chronic pain  To see orthopedist for MRI results today   Aggravating Factors  unsure   Pain Relieving Factors better for an hour or two after going to gym; ice            [facility] PT Assessment - 12/07/14 0001    Assessment   Medical Diagnosis Parkinson's disease  Sinemet-resistant tremors   Precautions   Precautions Fall  status post bilateral hip replacements   Balance Screen   Has the patient fallen in the past 6 months Yes   How many times? 3   Has the patient had a decrease in activity level because of a fear of falling?  No   Is the patient reluctant to leave their home because of a fear of falling?  No   Home Environment   Living Environment Private residence   Living Arrangements Spouse/significant other  caregivers   Available Help at Discharge Family;Personal care attendant   Type of [place]  to enter   Entrance Stairs-Number of Steps 1  low step   Home Layout One level   Prior Function   Level of Independence Needs assistance with gait;Needs assistance with transfers   Leisure Goes to [facility] twice per week; caregivers walk with patient, uses theraband activities   ROM / Strength   AROM / PROM / Strength Strength   Strength   Overall Strength Comments grossly tested 3+ to 4/5 bilateral lower extremities   Transfers   Transfers Sit to Stand;Stand to Sit   Sit to Stand With upper extremity assist;From chair/3-in-1;4: Min assist  posterior lean   Sit to Stand Details (indicate cue type and reason) Pt has increased episodes of freezing with turning to sit.  cues for hand placement   Stand to Sit 4: Min assist   Ambulation/Gait   Ambulation/Gait Yes    Ambulation/Gait Assistance 4: Min guard   Ambulation Distance (Feet) 120 Feet   Assistive device 4-wheeled walker  cues for upright posture and incr. step length   Gait Pattern Step-through pattern;Decreased step length - right;Decreased step length - left;Decreased dorsiflexion - right;Decreased dorsiflexion - left;Festinating;Narrow base of support  forward flexed posture; freezing with turns   Ambulation Surface Level;Indoor   Gait velocity 14 sec=2.34 ft/sec   Standardized Balance Assessment   Standardized Balance Assessment Berg Balance Test;Timed Up and Go Test   Berg Balance Test   Sit to Stand Needs minimal aid to stand or to stabilize   Standing Unsupported Able to stand 2 minutes with supervision   Sitting with Back Unsupported but Feet Supported on Floor or Stool Able to sit safely and securely 2 minutes   Stand to Sit Uses backs of legs against chair to control descent   Transfers Needs one person to assist   Standing Unsupported with Eyes Closed Able to stand 10 seconds with supervision   Standing Ubsupported with Feet Together Needs help to attain position but able to stand for 30 seconds with feet together   From Standing, Reach Forward with Outstretched Arm Reaches forward but needs supervision   From Standing Position, Pick up Object from Floor Unable to try/needs assist to keep balance   From Standing Position, Turn to Look Behind Over each Shoulder Needs supervision when turning   Turn 360 Degrees Needs assistance while turning  1 minute to turn   Standing Unsupported, Alternately Place Feet on Step/Stool Needs assistance to keep from falling or unable to try   Standing Unsupported, One Foot in [employer] balance while stepping or standing   Standing on One Leg Unable to try or needs assist to prevent fall   Total Score 17   Timed Up and Go Test   TUG Normal TUG   Normal TUG (seconds) 57.94                           PT Education - 12/07/14 1309     Education provided Yes   Education Details turning technique, initiated discussion/practice for reducing freezing with gait and turns   Person(s) Educated Patient;Spouse  daughter-in-law   Methods Explanation;Demonstration   Comprehension Returned demonstration;Verbal cues required;Need further instruction          PT Short Term Goals - 12/07/14 1630    PT SHORT TERM GOAL #1   Title Pt will perform HEP with family/caregiver supervision, for improved transfers, gait, balance.  Target 01/06/15   Time 4   Period Weeks  Status New   PT SHORT TERM GOAL #2   Title Pt will perform at least 8 of 10 reps of sit<>stand transfers with minimal to no UE support for improved transfer efficiency.   Time 4   Period Weeks   Status New   PT SHORT TERM GOAL #3   Title Pt will improve TUG score to less than or equal to 45 seconds for decreased fall risk.   Time 4   Period Weeks   Status New   PT SHORT TERM GOAL #4   Title Pt/family/caregiver will verbalize understanding of techniques to reduce freezing with gait and turns.   Time 4   Period Weeks   Status New   PT SHORT TERM GOAL #5   Title Pt will improve Berg Balance score to at least 22/56 for decreased fall risk.   Time 4   Period Weeks   Status New           PT Long Term Goals - 12/07/14 1633    PT LONG TERM GOAL #1   Title Pt/family/caregiver will verbalize understanding of fall prevention within the home environment.  Target 02/05/15   Time 8   Period Weeks   Status New   PT LONG TERM GOAL #2   Title Pt will perform 360 turns in less than 30 seconds for improved turning efficiency and safety.   Time 8   Period Weeks   Status New   PT LONG TERM GOAL #3   Title Pt will improve TUG score to less than or equal to 30 seconds for decreased fall risk.   Time 8   Period Weeks   Status New   PT LONG TERM GOAL #4   Title Pt will improve Berg score to at least 27/56 for decreased fall risk.   Time 8   Period Weeks   Status New    PT LONG TERM GOAL #5   Title Pt will improve gait velocity to at least 2.62 ft/sec for improved gait efficiency and safety.   Time 8   Period Weeks   Status New               Plan - 12/07/14 1311    Clinical Impression Statement [name] an 81 year old male with history of Parkinson's disease, who recently moved from [place] to [place] to be with family.  Pt presents with bilateral UE tremors, decreased timing and coordination of gait, festinating and freezing episodes with gait and turns, decreased functional stregnth, bradykinesia, rigidity, postural instability, decreased upright posture.  [name] at fall risk per TUG score of 57.94 sec, and Berg score of 17/56.  Pt takes 1 minute to complete 360 degree turn.  Pt requires cues for safety due to leaving walker and walking towards chair to sit.  Pt would benefit from skilled PT to address functional strength and transfers, gait, balance and techniques to reduce freezing for overall improved functional mobility and decreased fall risk.   Pt will benefit from skilled therapeutic intervention in order to improve on the following deficits Abnormal gait;Decreased balance;Decreased mobility;Decreased safety awareness;Difficulty walking;Decreased strength;Impaired flexibility;Postural dysfunction   Rehab Potential Good   PT Frequency 2x / week   PT Duration 8 weeks  plus eval   PT Treatment/Interventions ADLs/Self Care Home Management;Gait training;DME Instruction;Functional mobility training;Therapeutic activities;Therapeutic exercise;Balance training;Neuromuscular re-education;Patient/family education   PT Next Visit Plan Review  and continue instruction/practice on gait and turning techniques to reduce freezing; instruct family and caregivers in  HEP   PT Home Exercise Plan try PWR! Moves in sitting, standing, possibly supine; work on [place] and transfers   Consulted and Agree with Plan of Care Patient;Family member/caregiver    Family Member Consulted wife and daughter-in-law          G-Codes - 12-31-14 1637    Functional Assessment Tool Used TUG 57.94 seconds; gait velocity 2.34 ft/sec, Berg 17/56   Functional Limitation Mobility: Walking and moving around   Mobility: Walking and Moving Around Current Status [phone]) At least 60 percent but less than 80 percent impaired, limited or restricted   Mobility: Walking and Moving Around Goal Status [phone]) At least 40 percent but less than 60 percent impaired, limited or restricted       Problem List Patient Active Problem List   Diagnosis Date Noted  . 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 Visual hallucinations 11/12/2014  . Renal cell carcinoma of right kidney metastatic to other site 11/11/2014  . Parkinson's disease (tremor, stiffness, slow motion, unstable posture) 11/11/2014  . CKD (chronic kidney disease), stage III 11/11/2014    Sarann Tregre W. 12-31-2014, 4:39 PM  Frazier Butt., PT  [facility] [address] [place], [place], [address] Phone: [phone]   Fax:  [phone]

## 2014-12-10 ENCOUNTER — Ambulatory Visit: Payer: Medicare Other | Admitting: Physical Therapy

## 2014-12-10 DIAGNOSIS — R269 Unspecified abnormalities of gait and mobility: Secondary | ICD-10-CM

## 2014-12-10 DIAGNOSIS — R2689 Other abnormalities of gait and mobility: Secondary | ICD-10-CM

## 2014-12-10 DIAGNOSIS — R490 Dysphonia: Secondary | ICD-10-CM | POA: Diagnosis not present

## 2014-12-10 DIAGNOSIS — R258 Other abnormal involuntary movements: Secondary | ICD-10-CM

## 2014-12-10 NOTE — Therapy (Signed)
[facility] [address] [place], [place], [address] Phone: [phone]   Fax:  [phone]  Physical Therapy Treatment  Patient Details  Name: Richard Mcgee MRN: [id number] Date of Birth: 04-26-1928 Referring Provider:  Lajean Manes, MD  Encounter Date: 12/10/2014      PT End of Session - 12/12/14 2048    Visit Number 2   Number of Visits 17   Date for PT Re-Evaluation 02/05/15   Authorization Type Medicare/Tricare-g-code every 10th visit   PT Start Time 0932   PT Stop Time 1015   PT Time Calculation (min) 43 min   Equipment Utilized During Treatment Gait belt   Activity Tolerance Patient tolerated treatment well   Behavior During Therapy [facility] for tasks assessed/performed      Past Medical History  Diagnosis Date  . Hypertension   . GERD (gastroesophageal reflux disease)   . Parkinsonism   . Renal cell cancer 2010  . Degenerative disc disease, cervical   . Degenerative disc disease, lumbar   . Osteoarthritis   . Depression   . H/O rotator cuff tear   . BPH (benign prostatic hyperplasia)   . Renal insufficiency   . Lung nodules     Past Surgical History  Procedure Laterality Date  . Nephrectomy Right   . Total hip arthroplasty Bilateral   . Cervical laminectomy    . Cataract extraction, bilateral  2015    There were no vitals filed for this visit.  Visit Diagnosis:  Abnormality of gait  Festinating gait  Bradykinesia      Subjective Assessment - 12/12/14 2042    Subjective No changes since earlier this week.  Richard Mcgee, caregiver, present with patient today.      Therapeutic Activity: -Practiced sit<>stand transfer technique, 10 reps, then 5 additional reps throughout session, with consistent verbal cues for proper technique.  Provided verbal cues to both patient and caregiver for proper technique and for carryover at home.  -Practiced weightshifting upon initial standing with initiation  of gait.  Practiced turning with 4-wheeled walker to sit at mat-using "U-turn technique" to decrease freezing with turns.   Gait: -Gait training with 4-wheeled RW, with min guard assistance, with cues for increased step length ("keeping feet under the seat"), with occasional stops and starts to reset posture or when step length began to decrease.  Pt ambulates throughout gym area, at least 150 ft, 4 reps.  Practiced gait through doorways, with cues for upright posture and to look at visual target.  Provided verbal cues to both patient and caregiver for proper technique and for carryover at home.                               PT Short Term Goals - 12/07/14 1630    PT SHORT TERM GOAL #1   Title Pt will perform HEP with family/caregiver supervision, for improved transfers, gait, balance.  Target 01/06/15   Time 4   Period Weeks   Status New   PT SHORT TERM GOAL #2   Title Pt will perform at least 8 of 10 reps of sit<>stand transfers with minimal to no UE support for improved transfer efficiency.   Time 4   Period Weeks   Status New   PT SHORT TERM GOAL #3   Title Pt will improve TUG score to less than or equal to 45 seconds for decreased fall risk.   Time 4  Period Weeks   Status New   PT SHORT TERM GOAL #4   Title Pt/family/caregiver will verbalize understanding of techniques to reduce freezing with gait and turns.   Time 4   Period Weeks   Status New   PT SHORT TERM GOAL #5   Title Pt will improve Berg Balance score to at least 22/56 for decreased fall risk.   Time 4   Period Weeks   Status New           PT Long Term Goals - 12/07/14 1633    PT LONG TERM GOAL #1   Title Pt/family/caregiver will verbalize understanding of fall prevention within the home environment.  Target 02/05/15   Time 8   Period Weeks   Status New   PT LONG TERM GOAL #2   Title Pt will perform 360 turns in less than 30 seconds for improved turning efficiency and safety.   Time 8    Period Weeks   Status New   PT LONG TERM GOAL #3   Title Pt will improve TUG score to less than or equal to 30 seconds for decreased fall risk.   Time 8   Period Weeks   Status New   PT LONG TERM GOAL #4   Title Pt will improve Berg score to at least 27/56 for decreased fall risk.   Time 8   Period Weeks   Status New   PT LONG TERM GOAL #5   Title Pt will improve gait velocity to at least 2.62 ft/sec for improved gait efficiency and safety.   Time 8   Period Weeks   Status New             Plan - 12/12/14 2049    Clinical Impression Statement Patient and caregiver note improvement in reduction of freezing episodes from instruction on eval day.  Caregiver present today for instruction in sit<>stand techniques and for techniques to reduce freezing with gait.  Pt appears to have less freezing episodes with gait during PT treatment sesison techniques.   Pt will benefit from skilled therapeutic intervention in order to improve on the following deficits Abnormal gait;Decreased balance;Decreased mobility;Decreased safety awareness;Difficulty walking;Decreased strength;Impaired flexibility;Postural dysfunction   Rehab Potential Good   PT Frequency 2x / week   PT Duration 8 weeks  plus eval   PT Treatment/Interventions ADLs/Self Care Home Management;Gait training;DME Instruction;Functional mobility training;Therapeutic activities;Therapeutic exercise;Balance training;Neuromuscular re-education;Patient/family education   PT Next Visit Plan Review  and continue instruction/practice on gait and turning techniques to reduce freezing; instruct family and caregivers in Richard Mcgee try PWR! Moves in sitting, standing, possibly supine; work on Richard Mcgee and transfers   Consulted and Agree with Plan of Care Patient;Family member/caregiver   Family Member Consulted wife and daughter-in-law        Problem List Patient Active Problem List   Diagnosis Date Noted  .  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 Visual hallucinations 11/12/2014  . Renal cell carcinoma of right kidney metastatic to other site 11/11/2014  . Parkinson's disease (tremor, stiffness, slow motion, unstable posture) 11/11/2014  . CKD (chronic kidney disease), stage III 11/11/2014    Richard Mcgee W. 12/12/2014, 8:53 PM  Frazier Butt., PT  [facility] [address] [place], [place], [address] Phone: [phone]   Fax:  [phone]

## 2014-12-10 NOTE — Patient Instructions (Signed)
Sit to Stand Transfers:  1. Scoot out to the edge of the chair 2. Place your feet flat on the floor, shoulder width apart.  Make sure your feet are tucked just under your knees. 3. Lean forward (nose over toes) with momentum, and stand up tall with your best posture.  If you need to use your arms, use them as a quick boost up to stand. 4. If you are in a low or soft chair, you can lean back and then forward up to stand, in order to get more momentum. 5. Once you are standing, make sure you are looking ahead and standing tall.  To sit down:  1. Back up until you feel the chair behind your legs. 2. Bend at you hips, reaching  Back for you chair, if needed, then slowly squat to sit down on your chair.     Tips to reduce freezing episodes with standing or walking:  1.  Stand tall with your feet wide, so that you can rock and weight shift through your hips. 2.  Don't try to fight the freeze: if you begin taking slower, faster, smaller steps, STOP, get your posture tall, and RESET your posture and balance.  Take a deep breath before taking the BIG step to start again. 3.  March in place, with high knee stepping, to get started walking again. 4.  Use auditory cues:  Count out loud, think of a familiar tune or song or cadence, use pocket metronome, to use rhythm to get started walking again. 5.  Use visual cues:  Use a line to step over, use laser pointer line to step over, (using BIG steps) to start walking again. 6. Use visual targets to keep your posture tall (look ahead and focus on an object or target at eye level). 7. As you approach where your destination with walking, count your steps out loud and/or focus on your target with your eyes until you are fully there. 8. Use appropriate assistive device, as advised by your physical therapist to assist with taking longer, consistent steps.

## 2014-12-16 ENCOUNTER — Ambulatory Visit: Payer: Medicare Other

## 2014-12-16 DIAGNOSIS — G20 Parkinson's disease: Secondary | ICD-10-CM

## 2014-12-16 DIAGNOSIS — R490 Dysphonia: Secondary | ICD-10-CM | POA: Diagnosis not present

## 2014-12-16 NOTE — Therapy (Signed)
[facility] [address] [place], [place], [address] Phone: [phone]   Fax:  [phone]  Speech Language Pathology Evaluation  Patient Details  Name: Richard Mcgee MRN: [id number] Date of Birth: [birth year] Referring Provider:  Lajean Manes, MD  Encounter Date: 12/16/2014      End of Session - 12/16/14 1229    Visit Number 1   Number of Visits 16   Date for SLP Re-Evaluation 02/14/15   SLP Start Time 1103   SLP Stop Time  1147   SLP Time Calculation (min) 44 min   Activity Tolerance Patient tolerated treatment well      Past Medical History  Diagnosis Date  . Hypertension   . GERD (gastroesophageal reflux disease)   . Parkinsonism   . Renal cell cancer 2010  . Degenerative disc disease, cervical   . Degenerative disc disease, lumbar   . Osteoarthritis   . Depression   . H/O rotator cuff tear   . BPH (benign prostatic hyperplasia)   . Renal insufficiency   . Lung nodules     Past Surgical History  Procedure Laterality Date  . Nephrectomy Right   . Total hip arthroplasty Bilateral   . Cervical laminectomy    . Cataract extraction, bilateral  2015    There were no vitals filed for this visit.  Visit Diagnosis: Hypokinetic Parkinsonian dysphonia      Subjective Assessment - 12/16/14 1119    Subjective Pt does not report what he feels as an increase in people asking him to repeat himself since his dx in 2011.   Patient is accompained by: --  caregiver            SLP Evaluation [facility] - 12/16/14 1121    SLP Visit Information   SLP Received On 12/16/14   Onset Date 2011   Medical Diagnosis Parkinson's Disease   Pain Assessment   Currently in Pain? Yes   Pain Score 7    Pain Location Back   Pain Orientation Lower   Pain Type Chronic pain   Pain Radiating Towards feet   General Information   Mobility Status used four-wheeled walker    Cognition   Overall Cognitive Status Within  Functional Limits for tasks assessed   Auditory Comprehension   Overall Auditory Comprehension Appears within functional limits for tasks assessed   Verbal Expression   Overall Verbal Expression Appears within functional limits for tasks assessed   Oral Motor/Sensory Function   Overall Oral Motor/Sensory Function Impaired   Labial Strength Reduced   Labial Coordination Reduced   Lingual ROM Within Functional Limits   Lingual Symmetry Within Functional Limits   Lingual Strength Reduced   Lingual Coordination Reduced   Facial Symmetry Within Functional Limits   Velum Within Functional Limits   Motor Speech   Overall Motor Speech Impaired   Phonation Low vocal intensity   Resonance Within functional limits   Intelligibility Intelligible      10 minutes of conversational speech was reduced today, at average 68dB (WNL= average 70-72dB) with range of 65-73dB, when a sound level meter was placed 30 cm away from pt's mouth. Overall intelligibility for this listener in a quiet environment was approx 100%. When ambient noise increased (radio turned on), pt's loudness did not improve. It is this SLP's opinion that pt is especially difficult to understand in environments with background noise. Production of loud /a/ averaged 82dB and usual verbal cues needed for loudness. Pt would benefit from skilled  ST in order to improve speech intelligibility and pt's QOL.          SLP Education - 01-09-2015 1228    Education provided Yes   Education Details loud /a/, nature of speech production in patients with parkinson's   Person(s) Educated Patient   Methods Explanation;Verbal cues;Demonstration   Comprehension Verbalized understanding;Returned demonstration          SLP Short Term Goals - 01/09/2015 1231    SLP SHORT TERM GOAL #1   Title pt will demo loud /a/ average 84dB over 4 visits   Time 4   Period Weeks   Status New   SLP SHORT TERM GOAL #2   Title pt will produce 18/20 sentences with  volume at or above 70dB   Time 4   Period Weeks   Status New   SLP SHORT TERM GOAL #3   Title pt will produce simple conversation for 5 minutes at 70dB with rare verbal cues   Time 4   Period Weeks   Status New          SLP Long Term Goals - 01/09/15 1233    SLP LONG TERM GOAL #1   Title pt will engage in 10 minutes mod complex conversation with volume at or above average 70dB   Time 8   Status New   SLP LONG TERM GOAL #2   Title pt will maintain loud /a/ at average 84dB over 6 sessions   Time 8   Period Weeks   Status New          Plan - 09-Jan-2015 1229    Clinical Impression Statement Pt presents with reduced intelligibility/dysarthria characterized by reduced volume which inhibits communicaiton with family and friends. Skilled ST is necessaryto improve volume and thus speech intelligibility, especially in background noise.Marland Kitchen   Speech Therapy Frequency 2x / week   Duration --  8 weeks   Treatment/Interventions SLP instruction and feedback;Compensatory strategies;Patient/family education;Functional tasks;Cueing hierarchy   Potential to Achieve Goals Good          G-Codes - 09-Jan-2015 1234    Functional Assessment Tool Used noms - 5 (approx 25%)   Functional Limitations Motor speech   Motor Speech Current Status [phone]) At least 20 percent but less than 40 percent impaired, limited or restricted   Motor Speech Goal Status (N4709) At least 1 percent but less than 20 percent impaired, limited or restricted      Problem List Patient Active Problem List   Diagnosis Date Noted  . 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 Visual hallucinations 11/12/2014  . Renal cell carcinoma of right kidney metastatic to other site 11/11/2014  . Parkinson's disease (tremor, stiffness, slow motion, unstable posture) 11/11/2014  . CKD (chronic kidney disease), stage III 11/11/2014    [facility] , MS, CCC-SLP  01/09/15, 12:36 PM  [facility] [address] [place], [place], [address] Phone: [phone]   Fax:  [phone]

## 2014-12-16 NOTE — Patient Instructions (Signed)
Complete 5 loud "ah" at home twice a day.

## 2014-12-17 ENCOUNTER — Ambulatory Visit: Payer: Medicare Other | Admitting: *Deleted

## 2014-12-22 ENCOUNTER — Ambulatory Visit: Payer: Medicare Other | Admitting: Physical Therapy

## 2014-12-22 DIAGNOSIS — R269 Unspecified abnormalities of gait and mobility: Secondary | ICD-10-CM

## 2014-12-22 DIAGNOSIS — R258 Other abnormal involuntary movements: Secondary | ICD-10-CM

## 2014-12-22 DIAGNOSIS — R490 Dysphonia: Secondary | ICD-10-CM | POA: Diagnosis not present

## 2014-12-22 NOTE — Therapy (Signed)
[facility] [address] [place], [place], [address] Phone: [phone]   Fax:  [phone]  Physical Therapy Treatment  Patient Details  Name: Richard Mcgee MRN: [id number] Date of Birth: 1927-10-11 Referring Provider:  Lajean Manes, MD  Encounter Date: 12/22/2014      PT End of Session - 12/22/14 2216    Visit Number 3   Number of Visits 17   Date for PT Re-Evaluation 02/05/15   Authorization Type Medicare/Tricare-g-code every 10th visit   PT Start Time 0935   PT Stop Time 1015   PT Time Calculation (min) 40 min   Equipment Utilized During Treatment Gait belt   Activity Tolerance Patient tolerated treatment well   Behavior During Therapy [facility] for tasks assessed/performed      Past Medical History  Diagnosis Date  . Hypertension   . GERD (gastroesophageal reflux disease)   . Parkinsonism   . Renal cell cancer 2010  . Degenerative disc disease, cervical   . Degenerative disc disease, lumbar   . Osteoarthritis   . Depression   . H/O rotator cuff tear   . BPH (benign prostatic hyperplasia)   . Renal insufficiency   . Lung nodules     Past Surgical History  Procedure Laterality Date  . Nephrectomy Right   . Total hip arthroplasty Bilateral   . Cervical laminectomy    . Cataract extraction, bilateral  2015    There were no vitals filed for this visit.  Visit Diagnosis:  Bradykinesia  Abnormality of gait      Subjective Assessment - 12/22/14 0935    Subjective No changes, no falls   Patient is accompained by: --  Caregiver-Emanuel   Currently in Pain? No/denies                         [facility] Adult PT Treatment/Exercise - 12/22/14 0001    Transfers   Transfers Sit to Stand;Stand to Sit   Sit to Stand With upper extremity assist;5: Supervision  from 20 inch mat surface   Sit to Stand Details (indicate cue type and reason) 10 reps   Stand to Sit 4: Min guard   Ambulation/Gait    Ambulation/Gait Yes   Ambulation/Gait Assistance 5: Supervision   Ambulation/Gait Assistance Details cues for posture; negotiated narrow spaces and furniture   Ambulation Distance (Feet) 240 Feet  then 350 ft   Assistive device 4-wheeled walker   Gait Pattern Step-through pattern;Decreased step length - left;Narrow base of support   Ambulation Surface Level;Indoor           PWR [facility]) - 12/22/14 5027    PWR! exercises Moves in sitting      PWR! Up for posture x 10, PWR! Step for transition/initial stepping x 10, in sitting position.  Due to pt's reporting pain in L shoulder with attempts at shoulder abduction and lifting (due to previous injury), pt performs axial mobility exercises for weightshifting x 10 reps, then for trunk rotation x 10 reps.  PT provides cues for deliberate movement patterns and provides cues to patient and caregiver for how each exercise relates to functional activities.       PT Education - 12/22/14 2215    Education provided Yes   Education Details HEP-seated PWR! Moves and axial mobility seated weightshifting and trunk rotation.   Person(s) Educated Patient;Caregiver(s)   Methods Explanation;Demonstration;Handout   Comprehension Verbalized understanding;Returned demonstration;Verbal cues required  PT Short Term Goals - 12/07/14 1630    PT SHORT TERM GOAL #1   Title Pt will perform HEP with family/caregiver supervision, for improved transfers, gait, balance.  Target 01/06/15   Time 4   Period Weeks   Status New   PT SHORT TERM GOAL #2   Title Pt will perform at least 8 of 10 reps of sit<>stand transfers with minimal to no UE support for improved transfer efficiency.   Time 4   Period Weeks   Status New   PT SHORT TERM GOAL #3   Title Pt will improve TUG score to less than or equal to 45 seconds for decreased fall risk.   Time 4   Period Weeks   Status New   PT SHORT TERM GOAL #4   Title Pt/family/caregiver will verbalize  understanding of techniques to reduce freezing with gait and turns.   Time 4   Period Weeks   Status New   PT SHORT TERM GOAL #5   Title Pt will improve Berg Balance score to at least 22/56 for decreased fall risk.   Time 4   Period Weeks   Status New           PT Long Term Goals - 12/07/14 1633    PT LONG TERM GOAL #1   Title Pt/family/caregiver will verbalize understanding of fall prevention within the home environment.  Target 02/05/15   Time 8   Period Weeks   Status New   PT LONG TERM GOAL #2   Title Pt will perform 360 turns in less than 30 seconds for improved turning efficiency and safety.   Time 8   Period Weeks   Status New   PT LONG TERM GOAL #3   Title Pt will improve TUG score to less than or equal to 30 seconds for decreased fall risk.   Time 8   Period Weeks   Status New   PT LONG TERM GOAL #4   Title Pt will improve Berg score to at least 27/56 for decreased fall risk.   Time 8   Period Weeks   Status New   PT LONG TERM GOAL #5   Title Pt will improve gait velocity to at least 2.62 ft/sec for improved gait efficiency and safety.   Time 8   Period Weeks   Status New               Plan - 12/22/14 2217    Clinical Impression Statement Pt continues to demonstrate improvement in functional mobility, though he continues to need cues initially for turning to sit and occasionally for posture with gait.  Pt not noted to have freezing episodes during gait in PT session today.  Pt limited in PWR! Moves with UE coordinated movements due to L shoulder injury from previous fall.  Pt will continue to benefit from further skilled PT to address functional strength and mobility, gait and balance.   Pt will benefit from skilled therapeutic intervention in order to improve on the following deficits Abnormal gait;Decreased balance;Decreased mobility;Decreased safety awareness;Difficulty walking;Decreased strength;Impaired flexibility;Postural dysfunction   Rehab  Potential Good   PT Frequency 2x / week   PT Duration 8 weeks  wk 2 of 8   PT Treatment/Interventions ADLs/Self Care Home Management;Gait training;DME Instruction;Functional mobility training;Therapeutic activities;Therapeutic exercise;Balance training;Neuromuscular re-education;Patient/family education   PT Next Visit Plan Review HEP for sitting exercises; add standing exercises and weightshifting   PT Home Exercise Plan try PWR! Moves in sitting, standing,  possibly supine; work on [place] and transfers   Consulted and Agree with Plan of Care Patient;Family member/caregiver   Family Member Consulted wife and daughter-in-law        Problem List Patient Active Problem List   Diagnosis Date Noted  . 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 Visual hallucinations 11/12/2014  . Renal cell carcinoma of right kidney metastatic to other site 11/11/2014  . Parkinson's disease (tremor, stiffness, slow motion, unstable posture) 11/11/2014  . CKD (chronic kidney disease), stage III 11/11/2014    Shaela Boer W. 12/22/2014, 10:24 PM Frazier Butt., PT [facility] [address] [place], [place], [address] Phone: [phone]   Fax:  [phone]

## 2014-12-22 NOTE — Patient Instructions (Signed)
-  Pt and caregiver provided handouts for PWR! Moves in sitting:  PWR! Up (focus on good posture and scapular squeezes) x 10, PWR! Step x 10  -Provided pictures for seated weigthshifting and trunk rotation as part of axial mobility program handouts

## 2014-12-28 ENCOUNTER — Encounter: Payer: Medicare Other | Admitting: Speech Pathology

## 2014-12-29 ENCOUNTER — Ambulatory Visit: Payer: Medicare Other | Admitting: Physical Therapy

## 2014-12-29 ENCOUNTER — Other Ambulatory Visit (HOSPITAL_COMMUNITY): Payer: Self-pay | Admitting: Geriatric Medicine

## 2014-12-29 DIAGNOSIS — R490 Dysphonia: Secondary | ICD-10-CM | POA: Diagnosis not present

## 2014-12-29 DIAGNOSIS — R2689 Other abnormalities of gait and mobility: Secondary | ICD-10-CM

## 2014-12-29 DIAGNOSIS — R011 Cardiac murmur, unspecified: Secondary | ICD-10-CM

## 2014-12-29 DIAGNOSIS — R258 Other abnormal involuntary movements: Secondary | ICD-10-CM

## 2014-12-29 DIAGNOSIS — R269 Unspecified abnormalities of gait and mobility: Secondary | ICD-10-CM

## 2014-12-29 NOTE — Patient Instructions (Signed)
Provided handout for PWR! Moves in Standing: -PWR! Up x 10 -PWR! Rock x 10, then rocking to lift opposite foot x 10 -PWR! Step to side x 10, then step forward x 10  Recommended UE support at chair, walker or counter, with supervision/assistance of caregiver -To be performed once per day

## 2014-12-29 NOTE — Therapy (Signed)
[facility] [address] [place], [place], [address] Phone: [phone]   Fax:  [phone]  Physical Therapy Treatment  Patient Details  Name: Richard Mcgee MRN: [id number] Date of Birth: 1927-07-24 Referring Provider:  Lajean Manes, MD  Encounter Date: 12/29/2014      PT End of Session - 12/29/14 1033    Visit Number 4   Number of Visits 17   Date for PT Re-Evaluation 02/05/15   Authorization Type Medicare/Tricare-g-code every 10th visit   PT Start Time 0934   PT Stop Time 1014   PT Time Calculation (min) 40 min   Equipment Utilized During Treatment Gait belt   Activity Tolerance Patient tolerated treatment well   Behavior During Therapy [facility] for tasks assessed/performed      Past Medical History  Diagnosis Date  . Hypertension   . GERD (gastroesophageal reflux disease)   . Parkinsonism   . Renal cell cancer 2010  . Degenerative disc disease, cervical   . Degenerative disc disease, lumbar   . Osteoarthritis   . Depression   . H/O rotator cuff tear   . BPH (benign prostatic hyperplasia)   . Renal insufficiency   . Lung nodules     Past Surgical History  Procedure Laterality Date  . Nephrectomy Right   . Total hip arthroplasty Bilateral   . Cervical laminectomy    . Cataract extraction, bilateral  2015    There were no vitals filed for this visit.  Visit Diagnosis:  Bradykinesia  Abnormality of gait  Festinating gait      Subjective Assessment - 12/29/14 0936    Subjective No changes since last visit; no falls   Patient is accompained by: --  Caregiver-Emanuel   Currently in Pain? Yes   Pain Score 6    Pain Location Back   Pain Orientation Lower   Pain Descriptors / Indicators Aching   Pain Type Chronic pain   Aggravating Factors  Bending over to brush teeth and during standing exercises   Pain Relieving Factors injections of cortisone helps at times                          [facility] Adult PT Treatment/Exercise - 12/29/14 1026    Transfers   Transfers Sit to Stand;Stand to Sit   Sit to Stand With upper extremity assist;5: Supervision   Sit to Stand Details (indicate cue type and reason) 5 reps   Stand to Sit 5: Supervision   Ambulation/Gait   Ambulation/Gait Yes   Ambulation/Gait Assistance 5: Supervision   Ambulation/Gait Assistance Details cues for posture; incorporated turning to sit using "U-turn" method with cues for marching for improved foot clearance   Ambulation Distance (Feet) 240 Feet  then 50 ft, then 100 ft   Assistive device 4-wheeled walker   Gait Pattern Step-through pattern;Decreased step length - left;Narrow base of support   Ambulation Surface Level;Indoor   Exercises   Exercises Knee/Hip   Knee/Hip Exercises: Aerobic   Stepper SciFit Seated Stepper, Level 2, 4 extremities x 10 minutes with RPM >75, for increased intensity           [facility]) - 12/29/14 0939    PWR! exercises Moves in sitting;Moves in standing   PWR! Up 10 reps  at chair, min guard assist   PWR! Rock 10 reps  2 sets, at chair, min guard assist   PWR Step 10 reps   Comments PT provides initial  instructional cues as well as min guard assistance;   PWR! Up 10   PWR! Rock 10  modifed-axial trunk mobility program   PWR! Twist 10  modified-axial trunk mobility program   PWR! Step 10   Comments Review of HEP-pt demonstrates understanding with visual and verbal cues; caregiver verbalizes understanding of HEP      PWR! Up performed for posture, PWR! Rock performed for [employer] and PWR! Step performed for improved transition stepping.  PT provides cues for increased intensity and large amplitude movement.       PT Education - 12/29/14 1032    Education provided Yes   Education Details HEP-standing PWR! moves (Up, Rock, [place], step side)   Person(s) Educated Patient;Caregiver(s)   Methods  Explanation;Demonstration;Handout;Verbal cues   Comprehension Verbalized understanding;Returned demonstration;Verbal cues required          PT Short Term Goals - 12/07/14 1630    PT SHORT TERM GOAL #1   Title Pt will perform HEP with family/caregiver supervision, for improved transfers, gait, balance.  Target 01/06/15   Time 4   Period Weeks   Status New   PT SHORT TERM GOAL #2   Title Pt will perform at least 8 of 10 reps of sit<>stand transfers with minimal to no UE support for improved transfer efficiency.   Time 4   Period Weeks   Status New   PT SHORT TERM GOAL #3   Title Pt will improve TUG score to less than or equal to 45 seconds for decreased fall risk.   Time 4   Period Weeks   Status New   PT SHORT TERM GOAL #4   Title Pt/family/caregiver will verbalize understanding of techniques to reduce freezing with gait and turns.   Time 4   Period Weeks   Status New   PT SHORT TERM GOAL #5   Title Pt will improve Berg Balance score to at least 22/56 for decreased fall risk.   Time 4   Period Weeks   Status New           PT Long Term Goals - 12/07/14 1633    PT LONG TERM GOAL #1   Title Pt/family/caregiver will verbalize understanding of fall prevention within the home environment.  Target 02/05/15   Time 8   Period Weeks   Status New   PT LONG TERM GOAL #2   Title Pt will perform 360 turns in less than 30 seconds for improved turning efficiency and safety.   Time 8   Period Weeks   Status New   PT LONG TERM GOAL #3   Title Pt will improve TUG score to less than or equal to 30 seconds for decreased fall risk.   Time 8   Period Weeks   Status New   PT LONG TERM GOAL #4   Title Pt will improve Berg score to at least 27/56 for decreased fall risk.   Time 8   Period Weeks   Status New   PT LONG TERM GOAL #5   Title Pt will improve gait velocity to at least 2.62 ft/sec for improved gait efficiency and safety.   Time 8   Period Weeks   Status New                Plan - 12/29/14 1034    Clinical Impression Statement Pt appears to be performing HEP with caregiver's assistance at home.  Pt performs standing exercises with UE and will benefit from their addition to  HEP.  Pt continues to respond well to cues for increased intensity of movement and larger amplitude movements.  Pt will continue to benefit from further skilled PT to address functional strength, mobility, gait and balance.   Pt will benefit from skilled therapeutic intervention in order to improve on the following deficits Abnormal gait;Decreased balance;Decreased mobility;Decreased safety awareness;Difficulty walking;Decreased strength;Impaired flexibility;Postural dysfunction   Rehab Potential Good   PT Frequency 2x / week   PT Duration 8 weeks  wk 3 of 8   PT Treatment/Interventions ADLs/Self Care Home Management;Gait training;DME Instruction;Functional mobility training;Therapeutic activities;Therapeutic exercise;Balance training;Neuromuscular re-education;Patient/family education   PT Next Visit Plan Review HEP for standing exercises; continue standing exercises for weightshifting; gait training activities   PT Home Exercise Plan try PWR! Moves in sitting, standing, possibly supine; work on [place] and transfers   Consulted and Agree with Plan of Care Patient;Family member/caregiver   Family Member Consulted caregiver-Emanuel        Problem List Patient Active Problem List   Diagnosis Date Noted  . 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 Visual hallucinations 11/12/2014  . Renal cell carcinoma of right kidney metastatic to other site 11/11/2014  . Parkinson's disease (tremor, stiffness, slow motion, unstable posture) 11/11/2014  . CKD (chronic kidney disease), stage III 11/11/2014    MARRIOTT,AMY W. 12/29/2014, 10:42 AM  Frazier Butt., PT  [facility] [address] [place], [place], [address] Phone: [phone]   Fax:  [phone]

## 2014-12-31 ENCOUNTER — Ambulatory Visit: Payer: Medicare Other | Attending: Neurology | Admitting: Physical Therapy

## 2014-12-31 DIAGNOSIS — R2689 Other abnormalities of gait and mobility: Secondary | ICD-10-CM | POA: Insufficient documentation

## 2014-12-31 DIAGNOSIS — R258 Other abnormal involuntary movements: Secondary | ICD-10-CM | POA: Insufficient documentation

## 2014-12-31 DIAGNOSIS — R269 Unspecified abnormalities of gait and mobility: Secondary | ICD-10-CM | POA: Insufficient documentation

## 2014-12-31 DIAGNOSIS — R293 Abnormal posture: Secondary | ICD-10-CM | POA: Insufficient documentation

## 2014-12-31 NOTE — Therapy (Signed)
[facility] [address] [place], [place], [address] Phone: [phone]   Fax:  [phone]  Physical Therapy Treatment  Patient Details  Name: Richard Mcgee MRN: [id number] Date of Birth: 1927-12-01 Referring Provider:  Lajean Manes, MD  Encounter Date: 12/31/2014      PT End of Session - 01/04/15 0829    Visit Number 5   Number of Visits 17   Date for PT Re-Evaluation 02/05/15   Authorization Type Medicare/Tricare-g-code every 10th visit   PT Start Time 0941   PT Stop Time 1019   PT Time Calculation (min) 38 min   Equipment Utilized During Treatment Gait belt   Activity Tolerance Patient tolerated treatment well   Behavior During Therapy [facility] for tasks assessed/performed      Past Medical History  Diagnosis Date  . Hypertension   . GERD (gastroesophageal reflux disease)   . Parkinsonism   . Renal cell cancer 2010  . Degenerative disc disease, cervical   . Degenerative disc disease, lumbar   . Osteoarthritis   . Depression   . H/O rotator cuff tear   . BPH (benign prostatic hyperplasia)   . Renal insufficiency   . Lung nodules     Past Surgical History  Procedure Laterality Date  . Nephrectomy Right   . Total hip arthroplasty Bilateral   . Cervical laminectomy    . Cataract extraction, bilateral  2015    There were no vitals filed for this visit.  Visit Diagnosis:  Abnormality of gait  Bradykinesia      Subjective Assessment - 01/04/15 0825    Subjective Pain coming and coming; almost fell at night a few nights ago   Patient is accompained by: --  Carlynn Spry   Currently in Pain? Yes   Pain Score 7    Pain Location Back   Pain Orientation Lower   Pain Descriptors / Indicators Aching   Pain Type Chronic pain   Aggravating Factors  bending over to brush teeth; during standing exercises   Pain Relieving Factors injections of cortisone help at times        Neuro Re-education: Pt  reviews/performs standing modified PWR! Moves x 10 reps each (given as HEP last visit).  [name] able to perform with bilateral UE support and superivision/cues.  In supine:  PWR! Moves:  PWR! Up for posture x 10 reps through shoulders, then through hips (bridging) x 10, PWR! Rock x 10, [place]! Twist (modified with hands together) with knees bent x 10, PWR! Step x 10, with assistance for R foot placement.  At end of session, pt ambulates 230 ft, 4-wheeled RW with min guard assistance, with cues for improved foot clearance, posture, heelstrike.  Pt performs start/stops during gait with brief rest breaks to reset posture.  Pt ambulates additional 150 ft. With 4-wheeled RW with min guard and verbal cues.  Sit<>stand x 5 reps, then U-turns to sit x 3 reps, with supervision and verbal cues.                          PT Short Term Goals - 12/07/14 1630    PT SHORT TERM GOAL #1   Title Pt will perform HEP with family/caregiver supervision, for improved transfers, gait, balance.  Target 01/06/15   Time 4   Period Weeks   Status New   PT SHORT TERM GOAL #2   Title Pt will perform at least 8 of 10  reps of sit<>stand transfers with minimal to no UE support for improved transfer efficiency.   Time 4   Period Weeks   Status New   PT SHORT TERM GOAL #3   Title Pt will improve TUG score to less than or equal to 45 seconds for decreased fall risk.   Time 4   Period Weeks   Status New   PT SHORT TERM GOAL #4   Title Pt/family/caregiver will verbalize understanding of techniques to reduce freezing with gait and turns.   Time 4   Period Weeks   Status New   PT SHORT TERM GOAL #5   Title Pt will improve Berg Balance score to at least 22/56 for decreased fall risk.   Time 4   Period Weeks   Status New           PT Long Term Goals - 12/07/14 1633    PT LONG TERM GOAL #1   Title Pt/family/caregiver will verbalize understanding of fall prevention within the home environment.  Target  02/05/15   Time 8   Period Weeks   Status New   PT LONG TERM GOAL #2   Title Pt will perform 360 turns in less than 30 seconds for improved turning efficiency and safety.   Time 8   Period Weeks   Status New   PT LONG TERM GOAL #3   Title Pt will improve TUG score to less than or equal to 30 seconds for decreased fall risk.   Time 8   Period Weeks   Status New   PT LONG TERM GOAL #4   Title Pt will improve Berg score to at least 27/56 for decreased fall risk.   Time 8   Period Weeks   Status New   PT LONG TERM GOAL #5   Title Pt will improve gait velocity to at least 2.62 ft/sec for improved gait efficiency and safety.   Time 8   Period Weeks   Status New               Plan - 01/04/15 0830    Clinical Impression Statement During supine exercises, pt requires tactile and verbal cues for increased intensity of movement.  Pt would benefit from further skilled PT for large amplitude movements in varied positions to further improve functional mobility and gait.   Pt will benefit from skilled therapeutic intervention in order to improve on the following deficits Abnormal gait;Decreased balance;Decreased mobility;Decreased safety awareness;Difficulty walking;Decreased strength;Impaired flexibility;Postural dysfunction   Rehab Potential Good   PT Frequency 2x / week   PT Duration 8 weeks  wk 3 of 8   PT Treatment/Interventions ADLs/Self Care Home Management;Gait training;DME Instruction;Functional mobility training;Therapeutic activities;Therapeutic exercise;Balance training;Neuromuscular re-education;Patient/family education   PT Next Visit Plan supine PWR! Moves, standing and gait activities; check STGs   Consulted and Agree with Plan of Care Patient;Family member/caregiver   Family Member Consulted caregiver-Emanuel        Problem List Patient Active Problem List   Diagnosis Date Noted  . 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 Visual hallucinations  11/12/2014  . Renal cell carcinoma of right kidney metastatic to other site 11/11/2014  . Parkinson's disease (tremor, stiffness, slow motion, unstable posture) 11/11/2014  . CKD (chronic kidney disease), stage III 11/11/2014    Darica Goren W. 01/04/2015, 8:33 AM  For treatment session 12/31/14  Frazier Butt., PT [facility] [address] [place], [place], [address] Phone: [phone]  Fax:  [phone]

## 2015-01-04 ENCOUNTER — Other Ambulatory Visit (HOSPITAL_COMMUNITY): Payer: Medicare Other

## 2015-01-05 ENCOUNTER — Ambulatory Visit: Payer: Medicare Other | Admitting: Physical Therapy

## 2015-01-07 ENCOUNTER — Ambulatory Visit: Payer: Medicare Other | Admitting: Physical Therapy

## 2015-01-07 DIAGNOSIS — R293 Abnormal posture: Secondary | ICD-10-CM

## 2015-01-07 DIAGNOSIS — R258 Other abnormal involuntary movements: Secondary | ICD-10-CM

## 2015-01-07 DIAGNOSIS — R269 Unspecified abnormalities of gait and mobility: Secondary | ICD-10-CM | POA: Diagnosis not present

## 2015-01-07 NOTE — Therapy (Signed)
[facility] [address] [place], [place], [address] Phone: [phone]   Fax:  [phone]  Physical Therapy Treatment  Patient Details  Name: Richard Mcgee MRN: [id number] Date of Birth: 11-Oct-1927 Referring Provider:  Lajean Manes, MD  Encounter Date: 01/07/2015      PT End of Session - 01/07/15 1216    Visit Number 6   Number of Visits 17   Date for PT Re-Evaluation 02/05/15   Authorization Type Medicare/Tricare-g-code every 10th visit   PT Start Time 0935   PT Stop Time 1014   PT Time Calculation (min) 39 min   Equipment Utilized During Treatment Gait belt   Activity Tolerance Patient tolerated treatment well   Behavior During Therapy [facility] for tasks assessed/performed      Past Medical History  Diagnosis Date  . Hypertension   . GERD (gastroesophageal reflux disease)   . Parkinsonism   . Renal cell cancer 2010  . Degenerative disc disease, cervical   . Degenerative disc disease, lumbar   . Osteoarthritis   . Depression   . H/O rotator cuff tear   . BPH (benign prostatic hyperplasia)   . Renal insufficiency   . Lung nodules     Past Surgical History  Procedure Laterality Date  . Nephrectomy Right   . Total hip arthroplasty Bilateral   . Cervical laminectomy    . Cataract extraction, bilateral  2015    There were no vitals filed for this visit.  Visit Diagnosis:  Abnormality of gait  Bradykinesia  Postural instability      Subjective Assessment - 01/07/15 0936    Subjective Pain is better due to getting injection in back on Wednesday.  Per patient, no restrictions    Currently in Pain? Yes   Pain Score 5    Pain Location Back   Pain Orientation Lower   Pain Descriptors / Indicators Aching   Pain Type Chronic pain   Aggravating Factors  bending over and standing too long aggravates pain   Pain Relieving Factors injections/sitting alleviate pain                          [facility] Adult PT Treatment/Exercise - 01/07/15 0951    Balance   Balance Assessed Yes   Standardized Balance Assessment   Standardized Balance Assessment Berg Balance Test;Timed Up and Go Test   Berg Balance Test   Sit to Stand Able to stand using hands after several tries   Standing Unsupported Able to stand safely 2 minutes   Sitting with Back Unsupported but Feet Supported on Floor or Stool Able to sit safely and securely 2 minutes   Stand to Sit Controls descent by using hands   Transfers Needs one person to assist   Standing Unsupported with Eyes Closed Able to stand 10 seconds with supervision   Standing Ubsupported with Feet Together Able to place feet together independently and stand 1 minute safely   From Standing, Reach Forward with Outstretched Arm Can reach forward >12 cm safely (5")   From Standing Position, Pick up Object from Floor Able to pick up shoe, needs supervision   From Standing Position, Turn to Look Behind Over each Shoulder Looks behind one side only/other side shows less weight shift  L>R   Turn 360 Degrees Needs assistance while turning  to L>28 seconds has to stop   Standing Unsupported, Alternately Place Feet on Step/Stool Able to complete >2 steps/needs minimal  assist   Standing Unsupported, One Foot in Front Able to take small step independently and hold 30 seconds   Standing on One Leg Tries to lift leg/unable to hold 3 seconds but remains standing independently   Total Score 34    Berg score improved from 17/56 to 34/56.  Remains at fall risk and recommended patient continue using his rollator walker.    Sit<>stand x 10 reps from 20 inch mat, then 18 inch chair 10 reps each, with min UE support, with several episodes of posterior lean.  Pt requires min guard assist and cues for increased forward lean.     Gait:  Gait training using rollator walker 400 ft, then 345 ft, then 200 ft, with start/stops, with cues for postural repositioning, with cues for  increased step length.  Pt requires supervision during gait.  With walking and preparing to sit, pt utilizes strategy of U-turn to sit, using rollator.  Pt requires cueing for this strategy as well as marching to improve foot clearance and decrease incidence of festinating/freezing during turn.        PT Short Term Goals - 01/07/15 7622    PT SHORT TERM GOAL #1   Title Pt will perform HEP with family/caregiver supervision, for improved transfers, gait, balance.  Target 01/06/15   Status Achieved   PT SHORT TERM GOAL #2   Title Pt will perform at least 8 of 10 reps of sit<>stand transfers with minimal to no UE support for improved transfer efficiency.   Baseline Requires min guard assistance and cues for momentum   Status Partially Met   PT SHORT TERM GOAL #3   Title Pt will improve TUG score to less than or equal to 45 seconds for decreased fall risk.   Baseline 01/07/15:  TUG 28.74 seconds   Status Achieved   PT SHORT TERM GOAL #4   Title Pt/family/caregiver will verbalize understanding of techniques to reduce freezing with gait and turns.   Baseline caregiver has been present during most treatment sessions for education/carryover   Status Achieved   PT SHORT TERM GOAL #5   Title Pt will improve Berg Balance score to at least 22/56 for decreased fall risk.   Baseline Berg 34/56 on 01/07/15   Status Achieved           PT Long Term Goals - 01/07/15 1217    PT LONG TERM GOAL #1   Title Pt/family/caregiver will verbalize understanding of fall prevention within the home environment.  Target 02/05/15   Time 8   Period Weeks   Status On-going   PT LONG TERM GOAL #2   Title Pt will perform 360 turns in less than 30 seconds for improved turning efficiency and safety.   Time 8   Period Weeks   Status On-going   PT LONG TERM GOAL #3   Title Pt will improve TUG score to less than or equal to 30 seconds for decreased fall risk.-met 01/07/15-Goal modified:  improve TUG score to less than or equal  to 20 seconds.   Time 8   Period Weeks   Status New   PT LONG TERM GOAL #4   Title Pt will improve Berg score to at least 27/56 for decreased fall risk.01/07/15-met-modified goal:  improved TUG to at least 39/56 for decr. fall risk.   Time 8   Period Weeks   Status New   PT LONG TERM GOAL #5   Title Pt will improve gait velocity to at least 2.62  ft/sec for improved gait efficiency and safety.   Time 8   Period Weeks   Status On-going               Plan - 01/07/15 1219    Clinical Impression Statement Pt has met LTG #1, 3, 4, and 5.  LTG #2 partially met, with pt requiring min guard to correct posterior lean. LTGs modified based on patient's functional progress. Pt has made significant improvements in mobility over the course of therapy thus far; however, he remains at fall risk and he experiences increased freezing episodes when not using RW.   Pt will benefit from skilled therapeutic intervention in order to improve on the following deficits Abnormal gait;Decreased balance;Decreased mobility;Decreased safety awareness;Difficulty walking;Decreased strength;Impaired flexibility;Postural dysfunction   Rehab Potential Good   PT Frequency 2x / week   PT Duration 8 weeks  wk 3 of 8   PT Treatment/Interventions ADLs/Self Care Home Management;Gait training;DME Instruction;Functional mobility training;Therapeutic activities;Therapeutic exercise;Balance training;Neuromuscular re-education;Patient/family education   PT Next Visit Plan supine PWR! Moves/bed mobility activities; standing balance and gait activities   Consulted and Agree with Plan of Care Patient        Problem List Patient Active Problem List   Diagnosis Date Noted  . 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 Visual hallucinations 11/12/2014  . Renal cell carcinoma of right kidney metastatic to other site 11/11/2014  . Parkinson's disease (tremor, stiffness, slow motion, unstable posture) 11/11/2014  . CKD  (chronic kidney disease), stage III 11/11/2014    MARRIOTT,AMY W. 01/07/2015, 12:25 PM  Frazier Butt., PT  [facility] [address] [place], [place], [address] Phone: [phone]   Fax:  [phone]

## 2015-01-11 ENCOUNTER — Ambulatory Visit: Payer: Medicare Other | Admitting: Rehabilitative and Restorative Service Providers"

## 2015-01-11 DIAGNOSIS — R269 Unspecified abnormalities of gait and mobility: Secondary | ICD-10-CM

## 2015-01-11 DIAGNOSIS — R2689 Other abnormalities of gait and mobility: Secondary | ICD-10-CM

## 2015-01-11 NOTE — Therapy (Signed)
[facility] [address] [place], [place], [address] Phone: [phone]   Fax:  [phone]  Physical Therapy Treatment  Patient Details  Name: Richard Mcgee MRN: [id number] Date of Birth: Jun 26, 1928 Referring Provider:  Lajean Manes, MD  Encounter Date: 01/11/2015      PT End of Session - 01/11/15 1415    Visit Number 7   Number of Visits 17   Date for PT Re-Evaluation 02/05/15   Authorization Type Medicare/Tricare-g-code every 10th visit   PT Start Time 0934   PT Stop Time 1020   PT Time Calculation (min) 46 min   Equipment Utilized During Treatment Gait belt   Activity Tolerance Patient tolerated treatment well   Behavior During Therapy [facility] for tasks assessed/performed      Past Medical History  Diagnosis Date  . Hypertension   . GERD (gastroesophageal reflux disease)   . Parkinsonism   . Renal cell cancer 2010  . Degenerative disc disease, cervical   . Degenerative disc disease, lumbar   . Osteoarthritis   . Depression   . H/O rotator cuff tear   . BPH (benign prostatic hyperplasia)   . Renal insufficiency   . Lung nodules     Past Surgical History  Procedure Laterality Date  . Nephrectomy Right   . Total hip arthroplasty Bilateral   . Cervical laminectomy    . Cataract extraction, bilateral  2015    There were no vitals filed for this visit.  Visit Diagnosis:  Abnormality of gait  Festinating gait      Subjective Assessment - 01/11/15 0937    Subjective The patient reports the epidural from last week initially helped, but has worn off.  His pain was worse when getting ready this morning.   Currently in Pain? Yes   Pain Score --  varies   Pain Location Back   Pain Orientation Lower   Pain Descriptors / Indicators Aching   Pain Type Chronic pain   Pain Radiating Towards feet   Pain Onset More than a month ago   Pain Frequency Intermittent   Aggravating Factors  bending over and  standing   Pain Relieving Factors injections and sitting     NEUROMUSCULAR RE-EDUCATION: Pelvic initiation for sit<>stand transfers in sitting with reaching tasks with focus on anterior pelvic tilt> transitioning into functional task of sit<>stand with reaching and adding PWR! Arms as patient improved with initiation of task x 10 reps Standing activities initially in parallel bars for UE support with CGA moving R foot over walking stick laterally and return to middle eventually adding UEs for increased postural awareness, then repeated to L side  Stepping anterior/posterior with targets on the floor for visual cues and CGA to min A for safety  Standing lateral weight shift needed for turns  Supine large amplitude marching wide to narrow x 5 reps with R and L LEs Supine "T" position with arms rolling R and L with claps emphasizing return to middle power position with each repetitions Discussed use of these movements in assisting with bed mobility and discussed/demonstrated with caregiver   Gait: Gait with rollater RW initially with festinating gait pattern with cues to stop, restart x 100 ft x 2 reps  Functional gait activities rising from mat, walking 60 feet, turning to sit in a chair positioned in a tight space, and return to starting point on mat x 3 reps without festination        PT Short Term Goals -  01/07/15 0939    PT SHORT TERM GOAL #1   Title Pt will perform HEP with family/caregiver supervision, for improved transfers, gait, balance.  Target 01/06/15   Status Achieved   PT SHORT TERM GOAL #2   Title Pt will perform at least 8 of 10 reps of sit<>stand transfers with minimal to no UE support for improved transfer efficiency.   Baseline Requires min guard assistance and cues for momentum   Status Partially Met   PT SHORT TERM GOAL #3   Title Pt will improve TUG score to less than or equal to 45 seconds for decreased fall risk.   Baseline 01/07/15:  TUG 28.74 seconds   Status  Achieved   PT SHORT TERM GOAL #4   Title Pt/family/caregiver will verbalize understanding of techniques to reduce freezing with gait and turns.   Baseline caregiver has been present during most treatment sessions for education/carryover   Status Achieved   PT SHORT TERM GOAL #5   Title Pt will improve Berg Balance score to at least 22/56 for decreased fall risk.   Baseline Berg 34/56 on 01/07/15   Status Achieved           PT Long Term Goals - 01/07/15 1217    PT LONG TERM GOAL #1   Title Pt/family/caregiver will verbalize understanding of fall prevention within the home environment.  Target 02/05/15   Time 8   Period Weeks   Status On-going   PT LONG TERM GOAL #2   Title Pt will perform 360 turns in less than 30 seconds for improved turning efficiency and safety.   Time 8   Period Weeks   Status On-going   PT LONG TERM GOAL #3   Title Pt will improve TUG score to less than or equal to 30 seconds for decreased fall risk.-met 01/07/15-Goal modified:  improve TUG score to less than or equal to 20 seconds.   Time 8   Period Weeks   Status New   PT LONG TERM GOAL #4   Title Pt will improve Berg score to at least 27/56 for decreased fall risk.01/07/15-met-modified goal:  improved TUG to at least 39/56 for decr. fall risk.   Time 8   Period Weeks   Status New   PT LONG TERM GOAL #5   Title Pt will improve gait velocity to at least 2.62 ft/sec for improved gait efficiency and safety.   Time 8   Period Weeks   Status On-going               Plan - 01/11/15 1417    Clinical Impression Statement The patient significantly improved with festinating near carpet>tile threshold and during turns after performing standing weight shift tasks.  Continue to LTGs.   PT Next Visit Plan supine PWR! Moves/bed mobility activities; standing balance and gait activities   Consulted and Agree with Plan of Care Patient;Family member/caregiver   Family Member Consulted caregiver-Emanuel         Problem List Patient Active Problem List   Diagnosis Date Noted  . 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 Visual hallucinations 11/12/2014  . Renal cell carcinoma of right kidney metastatic to other site 11/11/2014  . Parkinson's disease (tremor, stiffness, slow motion, unstable posture) 11/11/2014  . CKD (chronic kidney disease), stage III 11/11/2014    Sheril Hammond, PT 01/11/2015, 2:18 PM  [facility] [address] [place], [place], [address] Phone: [phone]   Fax:  [phone]

## 2015-01-12 ENCOUNTER — Ambulatory Visit (HOSPITAL_COMMUNITY)
Admission: RE | Admit: 2015-01-12 | Discharge: 2015-01-12 | Disposition: A | Discharge status: Home / Self Care | Payer: Medicare Other | Source: Ambulatory Visit | Attending: Geriatric Medicine | Admitting: Geriatric Medicine

## 2015-01-12 DIAGNOSIS — I313 Pericardial effusion (noninflammatory): Secondary | ICD-10-CM | POA: Diagnosis not present

## 2015-01-12 DIAGNOSIS — R011 Cardiac murmur, unspecified: Secondary | ICD-10-CM

## 2015-01-12 DIAGNOSIS — I352 Nonrheumatic aortic (valve) stenosis with insufficiency: Secondary | ICD-10-CM | POA: Diagnosis not present

## 2015-01-12 DIAGNOSIS — I059 Rheumatic mitral valve disease, unspecified: Secondary | ICD-10-CM | POA: Diagnosis not present

## 2015-01-12 DIAGNOSIS — I77819 Aortic ectasia, unspecified site: Secondary | ICD-10-CM | POA: Diagnosis not present

## 2015-01-14 ENCOUNTER — Ambulatory Visit: Payer: Medicare Other | Admitting: Rehabilitative and Restorative Service Providers"

## 2015-01-14 DIAGNOSIS — R293 Abnormal posture: Secondary | ICD-10-CM

## 2015-01-14 DIAGNOSIS — R269 Unspecified abnormalities of gait and mobility: Secondary | ICD-10-CM | POA: Diagnosis not present

## 2015-01-14 NOTE — Therapy (Signed)
[facility] [address] [place], [place], [address] Phone: [phone]   Fax:  [phone]  Physical Therapy Treatment  Patient Details  Name: Richard Mcgee MRN: [id number] Date of Birth: Feb 11, 1928 Referring Provider:  Lajean Manes, MD  Encounter Date: 01/14/2015      PT End of Session - 01/14/15 1234    Visit Number 8   Number of Visits 17   Date for PT Re-Evaluation 02/05/15   Authorization Type Medicare/Tricare-g-code every 10th visit   PT Start Time 0935   PT Stop Time 1020   PT Time Calculation (min) 45 min   Equipment Utilized During Treatment Gait belt   Activity Tolerance Patient tolerated treatment well   Behavior During Therapy [facility] for tasks assessed/performed      Past Medical History  Diagnosis Date  . Hypertension   . GERD (gastroesophageal reflux disease)   . Parkinsonism   . Renal cell cancer 2010  . Degenerative disc disease, cervical   . Degenerative disc disease, lumbar   . Osteoarthritis   . Depression   . H/O rotator cuff tear   . BPH (benign prostatic hyperplasia)   . Renal insufficiency   . Lung nodules     Past Surgical History  Procedure Laterality Date  . Nephrectomy Right   . Total hip arthroplasty Bilateral   . Cervical laminectomy    . Cataract extraction, bilateral  2015    There were no vitals filed for this visit.  Visit Diagnosis:  Abnormality of gait  Postural instability      Subjective Assessment - 01/14/15 0939    Subjective Patient reports difficulty transitioning from carpets to hardwoods at home with rollater RW.   Currently in Pain? No/denies  "discomfort, but not pain"       NEUROMUSCULAR RE-EDUCATION: Supine large amplitude marching wide to narrow x 5 reps with R and L LEs and then alternating LEs. Supine "T" position with arms rolling R and L with claps emphasizing return to middle power position with each repetitions Discussed use of these  movements in assisting with bed mobility and discussed/demonstrated with caregiver Placed exercise in more functional task adding a sheet to use on the bed and practiced placing leg under/over sheet and reaching to pull sheet up.   Gait: Gait with rollater RW 120 ft x 2 reps, 60 ft, 30 ft and t/o tight spaces (between mats) and with bands on the floor to replicate transitioning from carpet/tile, as well as transitioning between floor surfaces with SBA to CGA and cues when festinating.  Functional gait activities rising from a chair, navigating a tight space, and then turning to sit in another chair.               PT Education - 01/14/15 1231    Education provided Yes   Education Details Reviewed techniques to reduce freezing   Person(s) Educated Patient;Caregiver(s)   Methods Explanation;Demonstration;Handout;Verbal cues   Comprehension Verbalized understanding;Returned demonstration;Verbal cues required          PT Short Term Goals - 01/07/15 0939    PT SHORT TERM GOAL #1   Title Pt will perform HEP with family/caregiver supervision, for improved transfers, gait, balance.  Target 01/06/15   Status Achieved   PT SHORT TERM GOAL #2   Title Pt will perform at least 8 of 10 reps of sit<>stand transfers with minimal to no UE support for improved transfer efficiency.   Baseline Requires min guard assistance and cues for  momentum   Status Partially Met   PT SHORT TERM GOAL #3   Title Pt will improve TUG score to less than or equal to 45 seconds for decreased fall risk.   Baseline 01/07/15:  TUG 28.74 seconds   Status Achieved   PT SHORT TERM GOAL #4   Title Pt/family/caregiver will verbalize understanding of techniques to reduce freezing with gait and turns.   Baseline caregiver has been present during most treatment sessions for education/carryover   Status Achieved   PT SHORT TERM GOAL #5   Title Pt will improve Berg Balance score to at least 22/56 for decreased fall risk.    Baseline Berg 34/56 on 01/07/15   Status Achieved           PT Long Term Goals - 01/07/15 1217    PT LONG TERM GOAL #1   Title Pt/family/caregiver will verbalize understanding of fall prevention within the home environment.  Target 02/05/15   Time 8   Period Weeks   Status On-going   PT LONG TERM GOAL #2   Title Pt will perform 360 turns in less than 30 seconds for improved turning efficiency and safety.   Time 8   Period Weeks   Status On-going   PT LONG TERM GOAL #3   Title Pt will improve TUG score to less than or equal to 30 seconds for decreased fall risk.-met 01/07/15-Goal modified:  improve TUG score to less than or equal to 20 seconds.   Time 8   Period Weeks   Status New   PT LONG TERM GOAL #4   Title Pt will improve Berg score to at least 27/56 for decreased fall risk.01/07/15-met-modified goal:  improved TUG to at least 39/56 for decr. fall risk.   Time 8   Period Weeks   Status New   PT LONG TERM GOAL #5   Title Pt will improve gait velocity to at least 2.62 ft/sec for improved gait efficiency and safety.   Time 8   Period Weeks   Status On-going               Plan - 01/14/15 1236    Clinical Impression Statement The patient shows improvement within the session on negotiating tight spaces with rollater and using techniques to reduce freezing/festinating.  He has small amplitude movements when moving during bed mobility tasks and PT addressed with PWR! supine exercises with functional task added for carryover to home.  Continue working towards [employer].   PT Next Visit Plan *Patient reported hard to get on/off of bar height stool that swivels at breakfast table (check in PT/set up for fxnl training), supine PWR! Moves/bed mobility activities; standing balance and gait activities   Consulted and Agree with Plan of Care Patient;Family member/caregiver   Family Member Consulted caregiver-Emanuel        Problem List Patient Active Problem List   Diagnosis Date Noted   . 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 Visual hallucinations 11/12/2014  . Renal cell carcinoma of right kidney metastatic to other site 11/11/2014  . Parkinson's disease (tremor, stiffness, slow motion, unstable posture) 11/11/2014  . CKD (chronic kidney disease), stage III 11/11/2014    Salem Mastrogiovanni, PT 01/14/2015, 12:39 PM  [facility] [address] [place], [place], [address] Phone: [phone]   Fax:  [phone]

## 2015-01-18 ENCOUNTER — Ambulatory Visit: Payer: Medicare Other | Admitting: Physical Therapy

## 2015-01-18 DIAGNOSIS — R269 Unspecified abnormalities of gait and mobility: Secondary | ICD-10-CM | POA: Diagnosis not present

## 2015-01-18 NOTE — Therapy (Signed)
[facility] [address] [place], [place], [address] Phone: [phone]   Fax:  [phone]  Physical Therapy Treatment  Patient Details  Name: Richard Mcgee MRN: [id number] Date of Birth: [birth year] Referring Provider:  Lajean Manes, MD  Encounter Date: 01/18/2015      PT End of Session - 01/18/15 1217    Visit Number 9   Number of Visits 17   Date for PT Re-Evaluation 02/05/15   Authorization Type Medicare/Tricare-g-code every 10th visit   PT Start Time 0936   PT Stop Time 1015   PT Time Calculation (min) 39 min   Equipment Utilized During Treatment Gait belt   Activity Tolerance Patient tolerated treatment well   Behavior During Therapy [facility] for tasks assessed/performed      Past Medical History  Diagnosis Date  . Hypertension   . GERD (gastroesophageal reflux disease)   . Parkinsonism   . Renal cell cancer 2010  . Degenerative disc disease, cervical   . Degenerative disc disease, lumbar   . Osteoarthritis   . Depression   . H/O rotator cuff tear   . BPH (benign prostatic hyperplasia)   . Renal insufficiency   . Lung nodules     Past Surgical History  Procedure Laterality Date  . Nephrectomy Right   . Total hip arthroplasty Bilateral   . Cervical laminectomy    . Cataract extraction, bilateral  2015    There were no vitals filed for this visit.  Visit Diagnosis:  Abnormality of gait      Subjective Assessment - 01/18/15 0939    Subjective No falls, nothing new to report; does want to go over bed mobility again today.          Bed mobility oriented exercises -Hooklying marching x 10 reps  -Hooklying marching in and out x 10 reps -Bridging x 10 reps -Hooklying trunk rotation and rolling practice R and L 10 reps each, using T-position arms for improved momentum and bed moblity      Bed mobility practice: Sit<>supine minimal assistance for lower extremity management and verbal cues  for momentum. Functional activities with sit<>stand practice x 10 reps from mat surface with min guard assistance and cues for forward momentum.  Standing balance activities- Alternating Step taps 2 sets x 10 reps at 6 inch step with bilateral UE support Double leg step ups x 8 reps, then single limb step ups x 10 reps, with bilateral UE support with min guard assistance and cues for posture.  Seated SciFit stepper 8 minutes, Level 2-2.5, 4 extremities, with cues for intensity >70 RPM.  Pt keeps RPM 68-71 RPM throughout 8 minutes                         PT Short Term Goals - 01/07/15 0939    PT SHORT TERM GOAL #1   Title Pt will perform HEP with family/caregiver supervision, for improved transfers, gait, balance.  Target 01/06/15   Status Achieved   PT SHORT TERM GOAL #2   Title Pt will perform at least 8 of 10 reps of sit<>stand transfers with minimal to no UE support for improved transfer efficiency.   Baseline Requires min guard assistance and cues for momentum   Status Partially Met   PT SHORT TERM GOAL #3   Title Pt will improve TUG score to less than or equal to 45 seconds for decreased fall risk.   Baseline 01/07/15:  TUG 28.74  seconds   Status Achieved   PT SHORT TERM GOAL #4   Title Pt/family/caregiver will verbalize understanding of techniques to reduce freezing with gait and turns.   Baseline caregiver has been present during most treatment sessions for education/carryover   Status Achieved   PT SHORT TERM GOAL #5   Title Pt will improve Berg Balance score to at least 22/56 for decreased fall risk.   Baseline Berg 34/56 on 01/07/15   Status Achieved           PT Long Term Goals - 01/07/15 1217    PT LONG TERM GOAL #1   Title Pt/family/caregiver will verbalize understanding of fall prevention within the home environment.  Target 02/05/15   Time 8   Period Weeks   Status On-going   PT LONG TERM GOAL #2   Title Pt will perform 360 turns in less than 30  seconds for improved turning efficiency and safety.   Time 8   Period Weeks   Status On-going   PT LONG TERM GOAL #3   Title Pt will improve TUG score to less than or equal to 30 seconds for decreased fall risk.-met 01/07/15-Goal modified:  improve TUG score to less than or equal to 20 seconds.   Time 8   Period Weeks   Status New   PT LONG TERM GOAL #4   Title Pt will improve Berg score to at least 27/56 for decreased fall risk.01/07/15-met-modified goal:  improved TUG to at least 39/56 for decr. fall risk.   Time 8   Period Weeks   Status New   PT LONG TERM GOAL #5   Title Pt will improve gait velocity to at least 2.62 ft/sec for improved gait efficiency and safety.   Time 8   Period Weeks   Status On-going               Plan - 01/18/15 1217    Clinical Impression Statement Pt continues to need frequent cueing and reminders for increased large amplitude movement pattern during functional activities.  Pt will continue to beneft from further skilled PT to address gait, balance, transfer for improved functional mobility.   Pt will benefit from skilled therapeutic intervention in order to improve on the following deficits Abnormal gait;Decreased balance;Decreased mobility;Decreased safety awareness;Difficulty walking;Decreased strength;Impaired flexibility;Postural dysfunction   Rehab Potential Good   PT Frequency 2x / week   PT Duration 8 weeks  wk 7 of 8   PT Treatment/Interventions ADLs/Self Care Home Management;Gait training;DME Instruction;Functional mobility training;Therapeutic activities;Therapeutic exercise;Balance training;Neuromuscular re-education;Patient/family education   PT Next Visit Plan Discuss continued fitness, checking goals next week (?DC); work on standing balance counter activities; [place] and Agree with Plan of Care Patient;Family member/caregiver   Family Member Consulted caregiver-Emanuel        Problem  List Patient Active Problem List   Diagnosis Date Noted  . 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 Visual hallucinations 11/12/2014  . Renal cell carcinoma of right kidney metastatic to other site 11/11/2014  . Parkinson's disease (tremor, stiffness, slow motion, unstable posture) 11/11/2014  . CKD (chronic kidney disease), stage III 11/11/2014    Gema Ringold W. 01/18/2015, 12:23 PM Frazier Butt., PT [facility] [address] [place], [place], [address] Phone: [phone]   Fax:  [phone]

## 2015-01-20 ENCOUNTER — Ambulatory Visit: Payer: Medicare Other | Admitting: Physical Therapy

## 2015-01-20 DIAGNOSIS — R258 Other abnormal involuntary movements: Secondary | ICD-10-CM

## 2015-01-20 DIAGNOSIS — R2689 Other abnormalities of gait and mobility: Secondary | ICD-10-CM

## 2015-01-20 DIAGNOSIS — R269 Unspecified abnormalities of gait and mobility: Secondary | ICD-10-CM

## 2015-01-20 NOTE — Therapy (Signed)
[facility] [address] [place], [place], [address] Phone: [phone]   Fax:  [phone]  Physical Therapy Treatment  Patient Details  Name: Linus Weckerly MRN: [id number] Date of Birth: 12-16-1927 Referring Provider:  Lajean Manes, MD  Encounter Date: 01/20/2015      PT End of Session - 01/20/15 1202    Visit Number 10   Number of Visits 17   Date for PT Re-Evaluation 02/05/15   Authorization Type Medicare/Tricare-g-code every 10th visit   PT Start Time 0936   PT Stop Time 1019   PT Time Calculation (min) 43 min   Equipment Utilized During Treatment Gait belt   Activity Tolerance Patient tolerated treatment well   Behavior During Therapy [facility] for tasks assessed/performed      Past Medical History  Diagnosis Date  . Hypertension   . GERD (gastroesophageal reflux disease)   . Parkinsonism   . Renal cell cancer 2010  . Degenerative disc disease, cervical   . Degenerative disc disease, lumbar   . Osteoarthritis   . Depression   . H/O rotator cuff tear   . BPH (benign prostatic hyperplasia)   . Renal insufficiency   . Lung nodules     Past Surgical History  Procedure Laterality Date  . Nephrectomy Right   . Total hip arthroplasty Bilateral   . Cervical laminectomy    . Cataract extraction, bilateral  2015    There were no vitals filed for this visit.  Visit Diagnosis:  Abnormality of gait  Bradykinesia  Festinating gait      Subjective Assessment - 01/20/15 0939    Subjective Still having difficulty getting in bed   Patient is accompained by: --  caregiver   Currently in Pain? No/denies                         [facility] Adult PT Treatment/Exercise - 01/20/15 0001    Bed Mobility   Bed Mobility Rolling Right;Rolling Left;Sitting - Scoot to Edge of Bed;Sit to Sidelying Left;Right Sidelying to Sit   Rolling Right 5: Supervision   Rolling Right Details (indicate cue type and  reason) Cues provided for all bed mobility to increase intensity/large amplitude/ momentum of movement for decreased effort   Rolling Left 5: Supervision   Left Sidelying to Sit 6: Modified independent (Device/Increase time)   Sitting - Scoot to Edge of Bed 6: Modified independent (Device/Increase time)   Sit to Sidelying Left 4: Min guard;4: Min assist   Sit to Sidelying Left Details (indicate cue type and reason) Cues for momentum seated on edge of bed for rocking prior to going to sidelying   Transfers   Transfers Sit to Stand;Stand to Sit   Sit to Stand 5: Supervision   Comments Practiced using rollator to turn to sit at mat, 2 reps with U-turn method with cues and supervision   High Level Balance   High Level Balance Activities Marching turns  Sidestep turns at counter/chair   High Level Balance Comments Turning 360 degrees no device >1 minute; practiced side step/clock turning to R and L direction multiple reps; 360 turn after practice 24.41 seconds           [facility]) - 01/20/15 1157    PWR! Up 10  bridging x 10 reps   PWR! Rock 10   PWR! Step 10 reps each side   Comments Cues for large amplitude movement  PT Education - 01/20/15 1201    Education provided Yes   Education Details turning techniques, use of rocking to improve bed mobility momentum for sit<>supine   Person(s) Educated Patient;Caregiver(s)   Methods Explanation;Demonstration;Handout   Comprehension Verbalized understanding;Returned demonstration;Verbal cues required          PT Short Term Goals - 01/07/15 0939    PT SHORT TERM GOAL #1   Title Pt will perform HEP with family/caregiver supervision, for improved transfers, gait, balance.  Target 01/06/15   Status Achieved   PT SHORT TERM GOAL #2   Title Pt will perform at least 8 of 10 reps of sit<>stand transfers with minimal to no UE support for improved transfer efficiency.   Baseline Requires min guard assistance and cues for momentum    Status Partially Met   PT SHORT TERM GOAL #3   Title Pt will improve TUG score to less than or equal to 45 seconds for decreased fall risk.   Baseline 01/07/15:  TUG 28.74 seconds   Status Achieved   PT SHORT TERM GOAL #4   Title Pt/family/caregiver will verbalize understanding of techniques to reduce freezing with gait and turns.   Baseline caregiver has been present during most treatment sessions for education/carryover   Status Achieved   PT SHORT TERM GOAL #5   Title Pt will improve Berg Balance score to at least 22/56 for decreased fall risk.   Baseline Berg 34/56 on 01/07/15   Status Achieved           PT Long Term Goals - 01/07/15 1217    PT LONG TERM GOAL #1   Title Pt/family/caregiver will verbalize understanding of fall prevention within the home environment.  Target 02/05/15   Time 8   Period Weeks   Status On-going   PT LONG TERM GOAL #2   Title Pt will perform 360 turns in less than 30 seconds for improved turning efficiency and safety.   Time 8   Period Weeks   Status On-going   PT LONG TERM GOAL #3   Title Pt will improve TUG score to less than or equal to 30 seconds for decreased fall risk.-met 01/07/15-Goal modified:  improve TUG score to less than or equal to 20 seconds.   Time 8   Period Weeks   Status New   PT LONG TERM GOAL #4   Title Pt will improve Berg score to at least 27/56 for decreased fall risk.01/07/15-met-modified goal:  improved TUG to at least 39/56 for decr. fall risk.   Time 8   Period Weeks   Status New   PT LONG TERM GOAL #5   Title Pt will improve gait velocity to at least 2.62 ft/sec for improved gait efficiency and safety.   Time 8   Period Weeks   Status On-going               Plan - 01/20/15 1203    Clinical Impression Statement Pt improves turning with cues for strategies including marching turns or sidestepping turns, for improved foot clearance with turns.  Caregiver reports pt is doing well with bed mobility, only  requiring cues and supervision.  Pt will continue to benefit from skilled PT to address balance and functional mobility and gait.   Pt will benefit from skilled therapeutic intervention in order to improve on the following deficits Abnormal gait;Decreased balance;Decreased mobility;Decreased safety awareness;Difficulty walking;Decreased strength;Impaired flexibility;Postural dysfunction   Rehab Potential Good   PT Frequency 2x / week  PT Duration 8 weeks   PT Treatment/Interventions ADLs/Self Care Home Management;Gait training;DME Instruction;Functional mobility training;Therapeutic activities;Therapeutic exercise;Balance training;Neuromuscular re-education;Patient/family education   PT Next Visit Plan Discuss continued fitness, checking goals next week /discuss D/C vs renew   Consulted and Agree with Plan of Care Patient;Family member/caregiver   Family Member Consulted caregiver-Emanuel          G-Codes - 2015/02/08 Sep 26, 1204    Functional Assessment Tool Used TUG:  28 seconds; Berg Balance 34/56   Functional Limitation Mobility: Walking and moving around   Mobility: Walking and Moving Around Current Status [phone]) At least 20 percent but less than 40 percent impaired, limited or restricted   Mobility: Walking and Moving Around Goal Status [phone]) At least 20 percent but less than 40 percent impaired, limited or restricted      Problem List Patient Active Problem List   Diagnosis Date Noted  . 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 Visual hallucinations 11/12/2014  . Renal cell carcinoma of right kidney metastatic to other site 11/11/2014  . Parkinson's disease (tremor, stiffness, slow motion, unstable posture) 11/11/2014  . CKD (chronic kidney disease), stage III 11/11/2014  Physical Therapy Progress Note  Dates of Reporting Period: 12/07/14 to 02-08-15  Objective Reports of Subjective Statement: Pt/caregiver reports that pt is walking better; caregiver reports bed mobility  with supervision and cues.  Objective Measurements: Berg Balance score 34/56 (improved from 17/56 at eval); TUG >57 seconds at eval improved to approx 28 seconds at goal check.  Pt's caregiver is assisting with carryover of exercises and education at home.  Goal Update: Note STG progress; LTGs were modified to address significant progress at goal check.  [name] continuing to progress towards LTGs.   Plan: Continue PT to address balance, gait, functional mobility.  Plan to fully assess LTGs within next 2 weeks, with discussion on progression to community fitness.  Reason Skilled Services are Required: Pt continues to be at fall risk per TUG and Berg scores.  Pt will continue to benefit from skilled PT services for further work on balance, transfers, gait and functional strengthening.  With strong family and caregiver support, continued carryover at home and functional improvements are anticipated.   Dhairya Corales W. February 08, 2015, 1:09 PM  Frazier Butt., PT  [facility] [address] [place], [place], [address] Phone: [phone]   Fax:  [phone]

## 2015-01-20 NOTE — Patient Instructions (Signed)
For Improved Ease of Turning:  1.  You can use the "Clock-turn" method:  Think about the numbers on a clock face and side step (step-together) around the face of the clock until you get to face the direction you need to go.  2.  You can march while turning:  High-step march your feet and turn your walker until you are facing the direction you need to go     FOR BED MOBILITY:  Once you sit on the edge of your bed, rock side to side, shifting through your hips, to get momentum to lie down on your side.

## 2015-01-24 ENCOUNTER — Ambulatory Visit: Payer: Medicare Other | Admitting: Physical Therapy

## 2015-02-02 ENCOUNTER — Ambulatory Visit: Payer: Medicare Other | Attending: Neurology | Admitting: Physical Therapy

## 2015-02-02 DIAGNOSIS — R258 Other abnormal involuntary movements: Secondary | ICD-10-CM | POA: Insufficient documentation

## 2015-02-02 DIAGNOSIS — R2689 Other abnormalities of gait and mobility: Secondary | ICD-10-CM | POA: Diagnosis present

## 2015-02-02 DIAGNOSIS — R269 Unspecified abnormalities of gait and mobility: Secondary | ICD-10-CM | POA: Insufficient documentation

## 2015-02-02 DIAGNOSIS — R293 Abnormal posture: Secondary | ICD-10-CM

## 2015-02-03 NOTE — Therapy (Signed)
[address] [place], [place], [address] Phone: [phone]   Fax:  [phone]  Physical Therapy Treatment  Patient Details  Name: Richard Mcgee MRN: [id number] Date of Birth: 1927-08-18 Referring Provider:  Lajean Manes, MD  Encounter Date: 02/02/2015      PT End of Session - 02/03/15 1314    Visit Number 11   Number of Visits 15  recert [id number] for 4 weeks   Date for PT Re-Evaluation 03/04/15   Authorization Type Medicare/Tricare-g-code every 10th visit   PT Start Time 0934   PT Stop Time 1015   PT Time Calculation (min) 41 min   Equipment Utilized During Treatment Gait belt   Activity Tolerance Patient tolerated treatment well   Behavior During Therapy [facility] for tasks assessed/performed      Past Medical History  Diagnosis Date  . Hypertension   . GERD (gastroesophageal reflux disease)   . Parkinsonism   . Renal cell cancer 2010  . Degenerative disc disease, cervical   . Degenerative disc disease, lumbar   . Osteoarthritis   . Depression   . H/O rotator cuff tear   . BPH (benign prostatic hyperplasia)   . Renal insufficiency   . Lung nodules     Past Surgical History  Procedure Laterality Date  . Nephrectomy Right   . Total hip arthroplasty Bilateral   . Cervical laminectomy    . Cataract extraction, bilateral  2015    There were no vitals filed for this visit.  Visit Diagnosis:  Abnormality of gait  Bradykinesia  Festinating gait  Postural instability      Subjective Assessment - 02/02/15 0937    Subjective Having a lot of aching in my hip joints this week   Patient is accompained by: --  caregiver   Currently in Pain? Yes   Pain Score 8    Pain Location Hip   Pain Orientation Right;Left   Pain Descriptors / Indicators Aching   Pain Type Acute pain   Pain Onset 1 to 4 weeks ago   Pain Frequency Intermittent   Aggravating Factors  standing and walking; when I get  up in the morning   Pain Relieving Factors reclined sitting alleviates                         [facility] Adult PT Treatment/Exercise - 02/02/15 0946    Ambulation/Gait   Ambulation/Gait Yes   Ambulation/Gait Assistance 5: Supervision   Ambulation/Gait Assistance Details cues for posture   Assistive device 4-wheeled walker   Gait Pattern Step-through pattern;Decreased step length - left;Narrow base of support   Ambulation Surface Level;Indoor   Gait velocity 13.66 sec = 2.4 ft/sec   Berg Balance Test   Sit to Stand Able to stand  independently using hands   Standing Unsupported Able to stand safely 2 minutes   Sitting with Back Unsupported but Feet Supported on Floor or Stool Able to sit safely and securely 2 minutes   Stand to Sit Sits safely with minimal use of hands   Transfers Able to transfer with verbal cueing and /or supervision   Standing Unsupported with Eyes Closed Able to stand 10 seconds safely   Standing Ubsupported with Feet Together Able to place feet together independently and stand for 1 minute with supervision   From Standing, Reach Forward with Outstretched Arm Can reach forward >12 cm safely (5")   From Standing Position, Pick up Object from  Floor Able to pick up shoe, needs supervision   From Standing Position, Turn to Look Behind Over each Shoulder Looks behind one side only/other side shows less weight shift   Turn 360 Degrees Needs assistance while turning  71 sec no cues; 18.73 sec cues with marching   Standing Unsupported, Alternately Place Feet on Step/Stool Able to stand independently and safely and complete 8 steps in 20 seconds   Standing Unsupported, One Foot in Front Able to plae foot ahead of the other independently and hold 30 seconds   Standing on One Leg Able to lift leg independently and hold 5-10 seconds  7.8 sec   Total Score 43   Timed Up and Go Test   TUG Normal TUG   Normal TUG (seconds) 36.79  at best with cues (59.30 sec  without cues, with freezing)                PT Education - 02/03/15 1313    Education provided Yes   Education Details POC/progress towards goals, plans to renew this visit; reviewed techniques to reduce freezing with gait; follow up with physician if hip and leg pain continues   Person(s) Educated Patient;Caregiver(s)   Methods Explanation;Demonstration;Handout   Comprehension Verbalized understanding;Verbal cues required          PT Short Term Goals - 01/07/15 0939    PT SHORT TERM GOAL #1   Title Pt will perform HEP with family/caregiver supervision, for improved transfers, gait, balance.  Target 01/06/15   Status Achieved   PT SHORT TERM GOAL #2   Title Pt will perform at least 8 of 10 reps of sit<>stand transfers with minimal to no UE support for improved transfer efficiency.   Baseline Requires min guard assistance and cues for momentum   Status Partially Met   PT SHORT TERM GOAL #3   Title Pt will improve TUG score to less than or equal to 45 seconds for decreased fall risk.   Baseline 01/07/15:  TUG 28.74 seconds   Status Achieved   PT SHORT TERM GOAL #4   Title Pt/family/caregiver will verbalize understanding of techniques to reduce freezing with gait and turns.   Baseline caregiver has been present during most treatment sessions for education/carryover   Status Achieved   PT SHORT TERM GOAL #5   Title Pt will improve Berg Balance score to at least 22/56 for decreased fall risk.   Baseline Berg 34/56 on 01/07/15   Status Achieved           PT Long Term Goals - 02/03/15 1315    PT LONG TERM GOAL #1   Title Pt/family/caregiver will verbalize understanding of fall prevention within the home environment.  Target 02/05/15  NEW TARGET DATE 03/04/15   Time 4   Period Weeks   Status On-going   PT LONG TERM GOAL #2   Title Pt will perform 360 turns in less than 30 seconds for improved turning efficiency and safety. MOdified:  with cues; NEW TARGET 03/04/15   Baseline 71  seconds no cues   Time 4   Period Weeks   Status On-going   PT LONG TERM GOAL #3   Title Pt will improve TUG score to less than or equal to 30 seconds for decreased fall risk.-met 01/07/15-Goal modified:  improve TUG score to less than or equal to 20 seconds.  GOAL MOdified 02/02/15:  TUG < or equal to 30 seconds with cues.  NEW TARGET 03/04/15   Baseline 02/02/15:  36.79 sec at best   Time 8   Period Weeks   Status Not Met   PT LONG TERM GOAL #4   Title Pt will improve Berg score to at least 27/56 for decreased fall risk.01/07/15-met-modified goal:  improved TUG to at least 39/56 for decr. fall risk.   Baseline 02/02/15 [name] 43/56   Time 8   Period Weeks   Status Achieved   PT LONG TERM GOAL #5   Title Pt will improve gait velocity to at least 2.62 ft/sec for improved gait efficiency and safety.   Baseline 02/02/15 gait velocity 2.4 ft/sec   Time 8   Period Weeks   Status Not Met   Additional Long Term Goals   Additional Long Term Goals Yes   PT LONG TERM GOAL #6   Title Pt/caregiver will verbalize plans for optimall continued fitness upon D/C from PT.  TARGET 03/04/15   Time 4   Period Weeks   Status New               Plan - 02/03/15 1456    Clinical Impression Statement Long term goals checked today-LTG #1 is ongoing; LTG #2 not met-modified to perform with cues.  LTG #3 not met-modified to perform with cues; pt requires cueing for best score (more true to life due to pt's significant freezing episodes and requiring cues at home for optimal mobility).  LTG #4 has been met, with improved static standing balance on Berg score, with score improved to 43/56.  Pt would continue to benefit from additional short course of therapy to follow-up on family/caregiver education for HEP and functional mobility activities and work on ideal transition to community fitness options.   Pt will benefit from skilled therapeutic intervention in order to improve on the following deficits Abnormal gait;Decreased  balance;Decreased mobility;Decreased safety awareness;Difficulty walking;Decreased strength;Impaired flexibility;Postural dysfunction   Rehab Potential Good   PT Frequency 1x / week  per recert today   PT Duration 4 weeks   PT Treatment/Interventions ADLs/Self Care Home Management;Gait training;DME Instruction;Functional mobility training;Therapeutic activities;Therapeutic exercise;Balance training;Neuromuscular re-education;Patient/family education   PT Next Visit Plan Continue to work on strategies to decrease freezing with initiation of gait and turns as well as discussion on community fitness options.   Consulted and Agree with Plan of Care Patient;Family member/caregiver   Family Member Consulted caregiver-Emanuel        Problem List Patient Active Problem List   Diagnosis Date Noted  . 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 Visual hallucinations 11/12/2014  . Renal cell carcinoma of right kidney metastatic to other site 11/11/2014  . Parkinson's disease (tremor, stiffness, slow motion, unstable posture) 11/11/2014  . CKD (chronic kidney disease), stage III 11/11/2014    Carlei Huang W. 02/03/2015, 3:03 PM  [facility] [address] [place], [place], [address] Phone: [phone]   Fax:  [phone]

## 2015-02-08 ENCOUNTER — Ambulatory Visit: Payer: Medicare Other | Admitting: Rehabilitative and Restorative Service Providers"

## 2015-02-08 DIAGNOSIS — R269 Unspecified abnormalities of gait and mobility: Secondary | ICD-10-CM

## 2015-02-08 DIAGNOSIS — R293 Abnormal posture: Secondary | ICD-10-CM

## 2015-02-08 NOTE — Therapy (Signed)
[facility] [address] [place], [place], [address] Phone: [phone]   Fax:  [phone]  Physical Therapy Treatment  Patient Details  Name: Shahin Knierim MRN: [id number] Date of Birth: 11-11-1927 Referring Provider:  Merlene Laughter, MD  Encounter Date: 02/08/2015      PT End of Session - 02/08/15 1239    Visit Number 12   Number of Visits 15   Date for PT Re-Evaluation 03/04/15   Authorization Type Medicare/Tricare-g-code every 10th visit   PT Start Time 1019   PT Stop Time 1103   PT Time Calculation (min) 44 min   Equipment Utilized During Treatment Gait belt   Activity Tolerance Patient tolerated treatment well   Behavior During Therapy [facility] for tasks assessed/performed      Past Medical History  Diagnosis Date  . Hypertension   . GERD (gastroesophageal reflux disease)   . Parkinsonism   . Renal cell cancer 2010  . Degenerative disc disease, cervical   . Degenerative disc disease, lumbar   . Osteoarthritis   . Depression   . H/O rotator cuff tear   . BPH (benign prostatic hyperplasia)   . Renal insufficiency   . Lung nodules     Past Surgical History  Procedure Laterality Date  . Nephrectomy Right   . Total hip arthroplasty Bilateral   . Cervical laminectomy    . Cataract extraction, bilateral  2015    There were no vitals filed for this visit.  Visit Diagnosis:  Abnormality of gait  Postural instability      Subjective Assessment - 02/08/15 1059    Subjective The patient inquires about using SPC vs. Rollater RW.  His caregiver reports the patient's son encourages him to use only rollater RW.  The patient feels limited by rollater RW and feels he can't exercise or move as well with device.  PT encouraged continued use of device.   Currently in Pain? No/denies      Gait: Ambulation with rollater RW from elevated mat surface to table moving chair back and turning to sit at table.  Patient  needs verbal cues to decrease festinating pattern and stop, bring feet wider, then step.  Performed functional gait working on rising from chair, walking with tall posture emphasized, turning to sit in tight space.  Patient continues to benefit from verbal and tactile cues for safety and performance.  NEUROMUSCULAR RE-EDUCATION: Standing weight shifting tasks working on wider base of support with lateral stepping near countertop for support Large sidestepping with CGA, Stepping anteriorly and then posteriorly for weight shift and working on initiating turns (grabbing sticky note from cabinet and turning to provide to therapist) Big and loud anterior lunge with return to midline with cues on space between feet at midline and power of movement 2 steps anterior, 2 steps posterior with cues on large amplitude movements Pt requires CGA to min A during balance activities in standing           PT Short Term Goals - 01/07/15 0939    PT SHORT TERM GOAL #1   Title Pt will perform HEP with family/caregiver supervision, for improved transfers, gait, balance.  Target 01/06/15   Status Achieved   PT SHORT TERM GOAL #2   Title Pt will perform at least 8 of 10 reps of sit<>stand transfers with minimal to no UE support for improved transfer efficiency.   Baseline Requires min guard assistance and cues for momentum   Status Partially Met  PT SHORT TERM GOAL #3   Title Pt will improve TUG score to less than or equal to 45 seconds for decreased fall risk.   Baseline 01/07/15:  TUG 28.74 seconds   Status Achieved   PT SHORT TERM GOAL #4   Title Pt/family/caregiver will verbalize understanding of techniques to reduce freezing with gait and turns.   Baseline caregiver has been present during most treatment sessions for education/carryover   Status Achieved   PT SHORT TERM GOAL #5   Title Pt will improve Berg Balance score to at least 22/56 for decreased fall risk.   Baseline Berg 34/56 on 01/07/15   Status  Achieved           PT Long Term Goals - 02/03/15 1315    PT LONG TERM GOAL #1   Title Pt/family/caregiver will verbalize understanding of fall prevention within the home environment.  Target 02/05/15  NEW TARGET DATE 03/04/15   Time 4   Period Weeks   Status On-going   PT LONG TERM GOAL #2   Title Pt will perform 360 turns in less than 30 seconds for improved turning efficiency and safety. MOdified:  with cues; NEW TARGET 03/04/15   Baseline 71 seconds no cues   Time 4   Period Weeks   Status On-going   PT LONG TERM GOAL #3   Title Pt will improve TUG score to less than or equal to 30 seconds for decreased fall risk.-met 01/07/15-Goal modified:  improve TUG score to less than or equal to 20 seconds.  GOAL MOdified 02/02/15:  TUG < or equal to 30 seconds with cues.  NEW TARGET 03/04/15   Baseline 02/02/15:  36.79 sec at best   Time 8   Period Weeks   Status Not Met   PT LONG TERM GOAL #4   Title Pt will improve Berg score to at least 27/56 for decreased fall risk.01/07/15-met-modified goal:  improved TUG to at least 39/56 for decr. fall risk.   Baseline 02/02/15 [name] 43/56   Time 8   Period Weeks   Status Achieved   PT LONG TERM GOAL #5   Title Pt will improve gait velocity to at least 2.62 ft/sec for improved gait efficiency and safety.   Baseline 02/02/15 gait velocity 2.4 ft/sec   Time 8   Period Weeks   Status Not Met   Additional Long Term Goals   Additional Long Term Goals Yes   PT LONG TERM GOAL #6   Title Pt/caregiver will verbalize plans for [employer] continued fitness upon D/C from PT.  TARGET 03/04/15   Time 4   Period Weeks   Status New               Plan - 02/08/15 1423    Clinical Impression Statement During turning tasks and negotiating tight spaces, the patient needs cues to remember to bring feet apart to initiate weight shifting and movement.  PT and patient discussed continuing use of rollater RW at this time.   PT Next Visit Plan Continue to work on strategies to  decrease freezing with initiation of gait and turns as well as discussion on community fitness options (PD classes @ [employer] vs[facility] [employer]?)   Consulted and Agree with Plan of Care Patient;Family member/caregiver   Family Member Consulted caregiver-Emanuel        Problem List Patient Active Problem List   Diagnosis Date Noted  . 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 Visual hallucinations 11/12/2014  .  Renal cell carcinoma of right kidney metastatic to other site 11/11/2014  . Parkinson's disease (tremor, stiffness, slow motion, unstable posture) 11/11/2014  . CKD (chronic kidney disease), stage III 11/11/2014    Varina Hulon, PT 02/08/2015, 2:25 PM  [facility] [address] [place], [place], [address] Phone: [phone]   Fax:  [phone]

## 2015-02-11 ENCOUNTER — Ambulatory Visit (INDEPENDENT_AMBULATORY_CARE_PROVIDER_SITE_OTHER): Payer: Medicare Other | Admitting: Neurology

## 2015-02-11 ENCOUNTER — Encounter: Payer: Self-pay | Admitting: Neurology

## 2015-02-11 VITALS — BP 120/70 | HR 68 | Ht 66.0 in | Wt 147.0 lb

## 2015-02-11 DIAGNOSIS — G20A1 Parkinson's disease without dyskinesia, without mention of fluctuations: Secondary | ICD-10-CM

## 2015-02-11 DIAGNOSIS — R441 Visual hallucinations: Secondary | ICD-10-CM | POA: Diagnosis not present

## 2015-02-11 DIAGNOSIS — G20 Parkinson's disease: Secondary | ICD-10-CM | POA: Diagnosis not present

## 2015-02-11 DIAGNOSIS — R251 Tremor, unspecified: Secondary | ICD-10-CM

## 2015-02-11 DIAGNOSIS — Z5181 Encounter for therapeutic drug level monitoring: Secondary | ICD-10-CM | POA: Diagnosis not present

## 2015-02-11 LAB — CBC WITH DIFFERENTIAL/PLATELET
Basophils Absolute: 0.1 10*3/uL (ref 0.0–0.1)
Basophils Relative: 1 % (ref 0–1)
EOS PCT: 1 % (ref 0–5)
Eosinophils Absolute: 0.1 10*3/uL (ref 0.0–0.7)
HEMATOCRIT: 35.7 % — AB (ref 39.0–52.0)
Hemoglobin: 11.8 g/dL — ABNORMAL LOW (ref 13.0–17.0)
LYMPHS PCT: 9 % — AB (ref 12–46)
Lymphs Abs: 0.8 10*3/uL (ref 0.7–4.0)
MCH: 32.2 pg (ref 26.0–34.0)
MCHC: 33.1 g/dL (ref 30.0–36.0)
MCV: 97.3 fL (ref 78.0–100.0)
MONO ABS: 0.9 10*3/uL (ref 0.1–1.0)
MPV: 10.4 fL (ref 8.6–12.4)
Monocytes Relative: 10 % (ref 3–12)
NEUTROS ABS: 7 10*3/uL (ref 1.7–7.7)
NEUTROS PCT: 79 % — AB (ref 43–77)
PLATELETS: 206 10*3/uL (ref 150–400)
RBC: 3.67 MIL/uL — ABNORMAL LOW (ref 4.22–5.81)
RDW: 13.6 % (ref 11.5–15.5)
WBC: 8.8 10*3/uL (ref 4.0–10.5)

## 2015-02-11 NOTE — Progress Notes (Signed)
Note routed to Dr Felipa Eth.

## 2015-02-11 NOTE — Progress Notes (Signed)
Richard Mcgee was seen today in the movement disorders clinic for neurologic consultation at the request of Dr. Darlen Round.  The consultation is for the evaluation of PD.  The records that were made available to me were reviewed.    Earliest records were from 2013, but it appears that the patient had seen him for longer than that.  This patient is accompanied in the office by his spouse and daughter in law who supplements the history.     The patient's first symptom that he can recall was right hand tremor.  Pts wife states he was initially dx with ET but he was later dx with PD.  They estimate that this was 5 years ago.  He was initially on no medication.  The patient has been on carbidopa/levodopa 25/100 since September, 2013.  In July, 2014 amantadine was added because of dyskinesia but by his follow-up visit in October, 2014 this medication had been discontinued.  Records do not make it clear why this was discontinued by the patient but the patients wife states that he had visual hallucinations.  In April 2015, Aricept was added but a subsequent note indicates that it was discontinued because of side effect, but does not indicate what the side effect was.  Pts wife/daughter in law states that he only took it once or twice but "he doesn't like to take medication."  [name] on carbidopa/levodopa 25/100, 2 at 6 am, 2 at noon, 2 at 5 pm.  He takes the noon dosage and the 5 pm dosage AFTER the meals.  Pt doesn't think that it helps but family reports that he tried to d/c a few weeks ago and sx's got worse (especially tremor).  The patient does not remember this.  The patient's wife does state that he was told by Dr. Darlen Round to take Benadryl as it could be helpful for Parkinson's disease.  The patient does not do this, but his wife wanted to know if it would be beneficial.  11/12/14 update:  The patient presents today for his on/off test.  He is accompanied by his daughter-in-law who supplements the history.  Since  our last visit, the patient has attended the [facility] and really has enjoyed it.  He is scheduled to start therapy on June 7.  He has not taken any of his levodopa since approximately 5:30 PM last night.  He has done worse this morning, but attributes that to the fact that he exercised vigorously and felt better yesterday.  He is currently generally taking his levodopa at 6 AM and then he will go back to sleep for 2 hours before he gets up for the day.  He then will take his next dose of medication at approximately noon and his last dose of medication at 5 PM.  02/11/15 update:  The patient is following up today regarding his Parkinson's disease and Parkinson's disease dementia.  He is accompanied by his wife who supplements the history.  He has levodopa resistant tremor.  His levodopa was increased last visit so that he is taking carbidopa/levodopa 25/100, 2 tablets at 6 AM/10 AM/2 PM/6 PM.  This was a total of 2 extra tablets per day.  While he does have some dyskinesia, his amantadine was discontinued some time ago because it increased hallucinations.  He was on Aricept in the past, but self discontinued that.  Pt states that hallucinations increased but just this week.  His walking is deteriorating over the last few weeks and  so has his speech.  No falls.  Caregivers are present 8-1pm and then 5-8pm, 7 days per week.  They have LTC insurance.  He is walking with walker all the time; that bothers him because pt wants to use the cane.  Paxil was just started about 6 weeks ago.  Not sure if helping mood.  Neuroimaging has previously been performed.  Wife believes it was a CT brain done about 5 years ago.   It is not available for my review today.  PREVIOUS MEDICATIONS: Sinemet and Amantadine (hallucinations), given Aricept but did not take it because he does not "like pills."  ALLERGIES:   Allergies  Allergen Reactions  . Sulfa Antibiotics     CURRENT MEDICATIONS:  Outpatient Encounter Prescriptions as  of 02/11/2015  Medication Sig  . acetaminophen-codeine (TYLENOL #3) 300-30 MG per tablet 1 tablet.  . carbidopa-levodopa (SINEMET IR) 25-100 MG per tablet Take 2 tablets by mouth 4 (four) times daily.  . Cholecalciferol 2000 UNITS CAPS Take 2,000 Units by mouth daily.  Marland Kitchen docusate sodium (COLACE) 50 MG capsule Take 50 mg by mouth 2 (two) times daily.  . Misc Natural Products (FIBER 7) POWD Take by mouth.  . Multiple Vitamin (MULTIVITAMIN) tablet Take 1 tablet by mouth daily.  . Multiple Vitamins-Minerals (ICAPS MV PO) Take by mouth daily.  Marland Kitchen PARoxetine (PAXIL-CR) 12.5 MG 24 hr tablet Take 12.5 mg by mouth daily.  . polyethylene glycol (MIRALAX / GLYCOLAX) packet Take 17 g by mouth daily.   No facility-administered encounter medications on file as of 02/11/2015.    PAST MEDICAL HISTORY:   Past Medical History  Diagnosis Date  . Hypertension   . GERD (gastroesophageal reflux disease)   . Parkinsonism   . Renal cell cancer 2010  . Degenerative disc disease, cervical   . Degenerative disc disease, lumbar   . Osteoarthritis   . Depression   . H/O rotator cuff tear   . BPH (benign prostatic hyperplasia)   . Renal insufficiency   . Lung nodules     PAST SURGICAL HISTORY:   Past Surgical History  Procedure Laterality Date  . Nephrectomy Right   . Total hip arthroplasty Bilateral   . Cervical laminectomy    . Cataract extraction, bilateral  2015    SOCIAL HISTORY:   Social History   Social History  . Marital Status: Married    Spouse Name: N/A  . Number of Children: N/A  . Years of Education: N/A   Occupational History  . 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 Smoking status: Former Research scientist (life sciences)  . Smokeless tobacco: Never Used     Comment: quit 1970's  . Alcohol Use: 0.0 oz/week    0 Standard drinks or equivalent per week     Comment: 1 time every 4 weeks  . Drug Use: No  .  Sexual Activity: Not on file   Other Topics Concern  . Not on file   Social History Narrative   Lives with wife in a one story home.  Has 2 children.  Retired from [employer].      FAMILY HISTORY:   Family Status  Relation Status Death Age  . Mother Deceased     CAD, DM, CHF  . Father Deceased     coal mine accident  . Brother Deceased     CA, renal cell  . Sister Deceased  liver CA  . Child Alive     2, alive and well    ROS:  A complete 10 system review of systems was obtained and was unremarkable apart from what is mentioned above.  The patient was seen in the office at approximately 10 AM and had last taken his medication at 6 AM  PHYSICAL EXAMINATION:    VITALS:   Filed Vitals:   02/11/15 0943  BP: 120/70  Pulse: 68  Height: 5\' 6"  (1.676 m)  Weight: 147 lb (66.679 kg)    GEN:  The patient appears stated age and is in NAD. HEENT:  Normocephalic, atraumatic.  The mucous membranes are moist. The superficial temporal arteries are without ropiness or tenderness. CV:  RRR Lungs:  CTAB Neck/HEME:  There are no carotid bruits bilaterally.  Neurological examination:  Orientation: The patient is alert and oriented to person and place, but had some difficulty with specifics on time.   Sensation: Sensation is intact to light touch throughout. Motor: Strength is 5/5 in the bilateral upper and lower extremities.   Shoulder shrug is equal and symmetric.  There is no pronator drift.   Movement examination: Tone: There is normal tone in the bilateral upper extremities.  Abnormal movements: There is moderate to severe resting tremor bilaterally, but it is more persistent on the right than the left. Coordination: There is decremation with RAM's, seen with finger taps and alternating supination/pronation of the forearm. Gait and Station: The patient has significant difficulty arising out of a deep-seated chair without the use of the hands and is unable to do this  at all. He is able to get out of the chair without assistance today, but ambulates with his walker down the hall.  He has start hesitation and the right foot sticks to the floor.  Once he is out in the hallway with his walker, he actually walks quite well and the stride length is better.  He has difficulty with turns.   ASSESSMENT/PLAN:  1.  Idiopathic Parkinson's disease, diagnosed since approximately 2011 and starting on levodopa in September, 2013.  -The patient's disease course has been complicated by hallucinations, cognitive impairment and postural instability.  -A UPDRS motor on/off test was done  11/12/14, which demonstrated that the patient does have levodopa resistant tremor, but that the levodopa was modestly helpful for rigidity and not all that helpful for gait and balance.  He is to continue 2 tablets at 6 AM/10 AM/2 PM/6 PM.    -Over the last few weeks, the patient and his family have noticed increasing difficulties with walking.  I'm going to check his CBC, chemistry, TSH and urinalysis.  I will call him Monday with the results.  If these are normal, I am going to try and stop the Paxil since he is only been on it about 6 weeks to make sure that is not the cause of his newer symptoms.  It certainly could just be worsening of the disease.  -He is having some increasing hallucinations.  This did not start when I increased the levodopa and has only been going on for about a week.  Again, we will check the above labs.  We talked about Nuplazid, but decided to check the labs first and if they were normal, to stop the Paxil.  If nothing changed then, then we discussed potentially adding Nuplazid.  We discussed the black box warning in detail and his wife understood implications. 2.  Sialorrhea  -Overall mild and while we discussed  Myobloc, they are not interested right now. 3.  Parkinson's dementia  -He was on Aricept but has discontinued it.  He does not like to take medications.   Fortunately, he does have caregivers and safety has been addressed in the home. 4.  Renal insufficiency  - This is being monitored by his PCP and urology.  Labs were ordered today as well  5.  Lung nodule  - He has metastatic renal cell that has been fairly slow to progress and they are just watching this. 6.  Dyskinesia  -Did not see any today.  Tried amantadine in the past but increased hallucinations.  7.  I will plan on seeing him back in the next few months, but they should plan on hearing from our office on Monday.  Greater than 50% of the 25 minute visit was spent in counseling and coordinating care.

## 2015-02-12 LAB — URINALYSIS, ROUTINE W REFLEX MICROSCOPIC
Bilirubin Urine: NEGATIVE
Glucose, UA: NEGATIVE
Hgb urine dipstick: NEGATIVE
LEUKOCYTES UA: NEGATIVE
Nitrite: NEGATIVE
SPECIFIC GRAVITY, URINE: 1.024 (ref 1.001–1.035)
pH: 5.5 (ref 5.0–8.0)

## 2015-02-12 LAB — COMPREHENSIVE METABOLIC PANEL
ALBUMIN: 3.8 g/dL (ref 3.6–5.1)
ALK PHOS: 68 U/L (ref 40–115)
ALT: 4 U/L — AB (ref 9–46)
AST: 11 U/L (ref 10–35)
BUN: 41 mg/dL — ABNORMAL HIGH (ref 7–25)
CALCIUM: 8.4 mg/dL — AB (ref 8.6–10.3)
CO2: 19 mmol/L — ABNORMAL LOW (ref 20–31)
CREATININE: 1.79 mg/dL — AB (ref 0.70–1.11)
Chloride: 107 mmol/L (ref 98–110)
Glucose, Bld: 93 mg/dL (ref 65–99)
Potassium: 4.7 mmol/L (ref 3.5–5.3)
SODIUM: 143 mmol/L (ref 135–146)
Total Bilirubin: 0.6 mg/dL (ref 0.2–1.2)
Total Protein: 6.1 g/dL (ref 6.1–8.1)

## 2015-02-12 LAB — URINALYSIS, MICROSCOPIC ONLY
Bacteria, UA: NONE SEEN [HPF]
CRYSTALS: NONE SEEN [HPF]
Casts: NONE SEEN [LPF]
RBC / HPF: NONE SEEN RBC/HPF (ref <=2)
YEAST: NONE SEEN [HPF]

## 2015-02-12 LAB — URINE CULTURE
Colony Count: NO GROWTH
Organism ID, Bacteria: NO GROWTH

## 2015-02-12 LAB — TSH: TSH: 2.914 u[IU]/mL (ref 0.350–4.500)

## 2015-02-14 ENCOUNTER — Telehealth: Payer: Self-pay | Admitting: Neurology

## 2015-02-14 NOTE — Telephone Encounter (Signed)
-----   Message from [place], DO sent at 02/14/2015  8:33 AM EDT ----- Let pt/wife know that labs looked okay.  See note for plan as previously indicated last week

## 2015-02-14 NOTE — Telephone Encounter (Signed)
Spoke with patient's wife. Made aware labs normal. They will stop Paxil and we will check back in a week to see how he is doing.

## 2015-02-16 ENCOUNTER — Ambulatory Visit: Payer: Medicare Other | Admitting: Physical Therapy

## 2015-02-16 DIAGNOSIS — R269 Unspecified abnormalities of gait and mobility: Secondary | ICD-10-CM

## 2015-02-16 NOTE — Therapy (Signed)
[facility] [address] [place] [facility] [place], [place], [address] Phone: [phone]   Fax:  [phone]  Physical Therapy Treatment  Patient Details  Name: Richard Mcgee MRN: [id number] Date of Birth: 1928-01-03 Referring Provider:  Lajean Manes, MD  Encounter Date: 02/16/2015      PT End of Session - 02/16/15 1019    Visit Number 13   Number of Visits 15   Date for PT Re-Evaluation 03/04/15   Authorization Type Medicare/Tricare-g-code every 10th visit   PT Start Time 0933   PT Stop Time 1011   PT Time Calculation (min) 38 min   Equipment Utilized During Treatment Gait belt   Activity Tolerance Patient tolerated treatment well   Behavior During Therapy [facility] for tasks assessed/performed      Past Medical History  Diagnosis Date  . Hypertension   . GERD (gastroesophageal reflux disease)   . Parkinsonism   . Renal cell cancer 2010  . Degenerative disc disease, cervical   . Degenerative disc disease, lumbar   . Osteoarthritis   . Depression   . H/O rotator cuff tear   . BPH (benign prostatic hyperplasia)   . Renal insufficiency   . Lung nodules     Past Surgical History  Procedure Laterality Date  . Nephrectomy Right   . Total hip arthroplasty Bilateral   . Cervical laminectomy    . Cataract extraction, bilateral  2015    There were no vitals filed for this visit.  Visit Diagnosis:  Abnormality of gait      Subjective Assessment - 02/16/15 0936    Subjective Denies falls or changes since last visit.   Currently in Pain? Yes   Pain Location Back  hip   Pain Orientation Right;Left   Pain Descriptors / Indicators Aching   Pain Frequency Intermittent   Aggravating Factors  comes and goes                         [facility] Adult PT Treatment/Exercise - 02/16/15 0942    Transfers   Transfers Sit to Stand;Stand to Sit   Sit to Stand 5: Supervision;From elevated surface;With upper extremity assist    Stand to Sit 5: Supervision;To elevated surface;With upper extremity assist   Number of Reps 2 sets  5 reps   Ambulation/Gait   Ambulation/Gait Yes   Ambulation/Gait Assistance 5: Supervision   Ambulation/Gait Assistance Details cues for posture, step length   Ambulation Distance (Feet) 315 Feet  then 200 ft, then 120 ft.   Assistive device 4-wheeled walker   Gait Pattern Step-through pattern;Decreased step length - left;Narrow base of support   Ambulation Surface Level;Indoor   High Level Balance   High Level Balance Activities Side stepping;Figure 8 turns  3 reps along counter; figure 8's with walker and rep. cues   High Level Balance Comments Varied direction stepping to targets at counter, side step and weightshift over wedge 10 reps bilat, then forward step and weightshift over wedge 10 reps bilat, for improved step height and length.  Marching in place x 10; alt step taps 4" step x 10                  PT Short Term Goals - 01/07/15 0939    PT SHORT TERM GOAL #1   Title Pt will perform HEP with family/caregiver supervision, for improved transfers, gait, balance.  Target 01/06/15   Status Achieved   PT SHORT TERM GOAL #2  Title Pt will perform at least 8 of 10 reps of sit<>stand transfers with minimal to no UE support for improved transfer efficiency.   Baseline Requires min guard assistance and cues for momentum   Status Partially Met   PT SHORT TERM GOAL #3   Title Pt will improve TUG score to less than or equal to 45 seconds for decreased fall risk.   Baseline 01/07/15:  TUG 28.74 seconds   Status Achieved   PT SHORT TERM GOAL #4   Title Pt/family/caregiver will verbalize understanding of techniques to reduce freezing with gait and turns.   Baseline caregiver has been present during most treatment sessions for education/carryover   Status Achieved   PT SHORT TERM GOAL #5   Title Pt will improve Berg Balance score to at least 22/56 for decreased fall risk.    Baseline Berg 34/56 on 01/07/15   Status Achieved           PT Long Term Goals - 02/03/15 1315    PT LONG TERM GOAL #1   Title Pt/family/caregiver will verbalize understanding of fall prevention within the home environment.  Target 02/05/15  NEW TARGET DATE 03/04/15   Time 4   Period Weeks   Status On-going   PT LONG TERM GOAL #2   Title Pt will perform 360 turns in less than 30 seconds for improved turning efficiency and safety. MOdified:  with cues; NEW TARGET 03/04/15   Baseline 71 seconds no cues   Time 4   Period Weeks   Status On-going   PT LONG TERM GOAL #3   Title Pt will improve TUG score to less than or equal to 30 seconds for decreased fall risk.-met 01/07/15-Goal modified:  improve TUG score to less than or equal to 20 seconds.  GOAL MOdified 02/02/15:  TUG < or equal to 30 seconds with cues.  NEW TARGET 03/04/15   Baseline 02/02/15:  36.79 sec at best   Time 8   Period Weeks   Status Not Met   PT LONG TERM GOAL #4   Title Pt will improve Berg score to at least 27/56 for decreased fall risk.01/07/15-met-modified goal:  improved TUG to at least 39/56 for decr. fall risk.   Baseline 02/02/15 Sharlene Motts 43/56   Time 8   Period Weeks   Status Achieved   PT LONG TERM GOAL #5   Title Pt will improve gait velocity to at least 2.62 ft/sec for improved gait efficiency and safety.   Baseline 02/02/15 gait velocity 2.4 ft/sec   Time 8   Period Weeks   Status Not Met   Additional Long Term Goals   Additional Long Term Goals Yes   PT LONG TERM GOAL #6   Title Pt/caregiver will verbalize plans for optimall continued fitness upon D/C from PT.  TARGET 03/04/15   Time 4   Period Weeks   Status New               Plan - 02/16/15 1020    Clinical Impression Statement Cues continue to be needed for patient during turns and initiaition of gait.  Pt has difficulty sequencing gait with figure-8 turns.  Pt appears to be continuing HEP and walking program with caregiver.  Pt  will likely be appropriate  for D/C next visit.   Pt will benefit from skilled therapeutic intervention in order to improve on the following deficits Abnormal gait;Decreased balance;Decreased mobility;Decreased safety awareness;Difficulty walking;Decreased strength;Impaired flexibility;Postural dysfunction   Rehab Potential Good  PT Frequency 1x / week   PT Duration 4 weeks   PT Treatment/Interventions ADLs/Self Care Home Management;Gait training;DME Instruction;Functional mobility training;Therapeutic activities;Therapeutic exercise;Balance training;Neuromuscular re-education;Patient/family education   PT Next Visit Plan Check goals and plan for discharge next week   Consulted and Agree with Plan of Care Patient;Family member/caregiver        Problem List Patient Active Problem List   Diagnosis Date Noted  . 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 Visual hallucinations 11/12/2014  . Renal cell carcinoma of right kidney metastatic to other site 11/11/2014  . Parkinson's disease (tremor, stiffness, slow motion, unstable posture) 11/11/2014  . CKD (chronic kidney disease), stage III 11/11/2014    MARRIOTT,AMY W. 02/16/2015, 10:37 PM  Frazier Butt., PT  [facility] [address] [place], [place], [address] Phone: [phone]   Fax:  [phone]

## 2015-02-21 ENCOUNTER — Telehealth: Payer: Self-pay | Admitting: Neurology

## 2015-02-21 NOTE — Telephone Encounter (Signed)
Called to check on patient after he has been off Paxil for one week. Wife states hallucinations are pretty much gone and his gait is better. Overall patient is doing well. Advised to stay off Paxil. Please advise if patient needs to do anything else.

## 2015-02-22 ENCOUNTER — Telehealth: Payer: Self-pay | Admitting: Neurology

## 2015-02-22 NOTE — Telephone Encounter (Signed)
Do they want to try nuplazid (I talked to them about this drug in the office, including its r/b/se and black box warning)

## 2015-02-22 NOTE — Telephone Encounter (Signed)
Spoke with patient's wife yesterday and she states hallucinations were pretty much gone. Please advise.

## 2015-02-22 NOTE — Telephone Encounter (Signed)
Spoke with patient. He states that hallucinations were really bad again last night. Please advise.

## 2015-02-22 NOTE — Telephone Encounter (Signed)
I'm not sure that we have release to speak with daughter in law but hard to treat based on conflicting reports.  I guess call wife (as long as on release) back and re-confirm what you spoke about.

## 2015-02-22 NOTE — Telephone Encounter (Signed)
Pt's daughter in law/ Jamaury Gumz, called to report that the pt is still having hallucinations/and the last dose of Paroxetine was last Friday/02/18/15/ [id number]

## 2015-02-22 NOTE — Telephone Encounter (Signed)
Spoke with patient's wife. They will discuss medication and get back to [place] if they want to start it.

## 2015-02-22 NOTE — Telephone Encounter (Signed)
Patients daughter in law called to request the nurse call patient wife back. She states that someone called and spoke with the patient he is very confused and now very agitated. Please advise

## 2015-02-22 NOTE — Telephone Encounter (Signed)
Called patient's wife again and verified that he did have hallucinations last night. She is now worried this could be from increase in Carbidopa Levodopa. Made aware I would send this message to Dr Tat for advise.

## 2015-02-23 ENCOUNTER — Ambulatory Visit: Payer: Medicare Other | Admitting: Physical Therapy

## 2015-02-23 DIAGNOSIS — R269 Unspecified abnormalities of gait and mobility: Secondary | ICD-10-CM

## 2015-02-23 DIAGNOSIS — R293 Abnormal posture: Secondary | ICD-10-CM

## 2015-02-23 DIAGNOSIS — R2689 Other abnormalities of gait and mobility: Secondary | ICD-10-CM

## 2015-02-23 DIAGNOSIS — R258 Other abnormal involuntary movements: Secondary | ICD-10-CM

## 2015-02-23 NOTE — Patient Instructions (Signed)
It is important to avoid accidents which may result in broken bones.  Here are a few ideas on how to make your home safer so you will be less likely to trip or fall.  1. Use nonskid mats or non slip strips in your shower or tub, on your bathroom floor and around sinks.  If you know that you have spilled water, wipe it up! 2. In the bathroom, it is important to have properly installed grab bars on the walls or on the edge of the tub.  Towel racks are NOT strong enough for you to hold onto or to pull on for support. 3. Stairs and hallways should have enough light.  Add lamps or night lights if you need ore light. 4. It is good to have handrails on both sides of the stairs if possible.  Always fix broken handrails right away. 5. It is important to see the edges of steps.  Paint the edges of outdoor steps white so you can see them better.  Put colored tape on the edge of inside steps. 6. Throw-rugs are dangerous because they can slide.  Removing the rugs is the best idea, but if they must stay, add adhesive carpet tape to prevent slipping. 7. Do not keep things on stairs or in the halls.  Remove small furniture that blocks the halls as it may cause you to trip.  Keep telephone and electrical cords out of the way where you walk. 8. Always were sturdy, rubber-soled shoes for good support.  Never wear just socks, especially on the stairs.  Socks may cause you to slip or fall.  Do not wear full-length housecoats as you can easily trip on the bottom.  9. Place the things you use the most on the shelves that are the easiest to reach.  If you use a stepstool, make sure it is in good condition.  If you feel unsteady, DO NOT climb, ask for help. 10. If a health professional advises you to use a cane or walker, do not be ashamed.  These items can keep you from falling and breaking your bones.  Tips to reduce freezing episodes with standing or walking:  11. Stand tall with your feet wide, so that you can rock and  weight shift through your hips. 12. Don't try to fight the freeze: if you begin taking slower, faster, smaller steps, STOP, get your posture tall, and RESET your posture and balance.  Take a deep breath before taking the BIG step to start again. 56. March in place, with high knee stepping, to get started walking again. 14. Use auditory cues:  Count out loud, think of a familiar tune or song or cadence, use pocket metronome, to use rhythm to get started walking again. 15. Use visual cues:  Use a line to step over, use laser pointer line to step over, (using BIG steps) to start walking again. 16. Use visual targets to keep your posture tall (look ahead and focus on an object or target at eye level). 17. As you approach where your destination with walking, count your steps out loud and/or focus on your target with your eyes until you are fully there. 18. Use appropriate assistive device, as advised by your physical therapist to assist with taking longer, consistent steps. 19.

## 2015-02-24 ENCOUNTER — Telehealth: Payer: Self-pay | Admitting: Neurology

## 2015-02-24 NOTE — Telephone Encounter (Signed)
Richard Mcgee, stopped by yesterday 02/23/15 and stated that pt will wait on new meds/Neuro exam unchanged/skin is poor, so will rehydrate & see if mental  State improves first. [phone]

## 2015-02-24 NOTE — Therapy (Signed)
[facility] [address] [place], [place], [address] Phone: [phone]   Fax:  [phone]  Physical Therapy Treatment  Patient Details  Name: Richard Mcgee MRN: [id number] Date of Birth: 1928-04-14 Referring Provider:  Lajean Manes, MD  Encounter Date: 02/23/2015      PT End of Session - 02/24/15 1202    Visit Number 14   Authorization Type Medicare/Tricare-g-code every 10th visit   PT Start Time 0935   PT Stop Time 1015   PT Time Calculation (min) 40 min   Equipment Utilized During Treatment Gait belt   Activity Tolerance Patient tolerated treatment well   Behavior During Therapy [facility] for tasks assessed/performed      Past Medical History  Diagnosis Date  . Hypertension   . GERD (gastroesophageal reflux disease)   . Parkinsonism   . Renal cell cancer 2010  . Degenerative disc disease, cervical   . Degenerative disc disease, lumbar   . Osteoarthritis   . Depression   . H/O rotator cuff tear   . BPH (benign prostatic hyperplasia)   . Renal insufficiency   . Lung nodules     Past Surgical History  Procedure Laterality Date  . Nephrectomy Right   . Total hip arthroplasty Bilateral   . Cervical laminectomy    . Cataract extraction, bilateral  2015    There were no vitals filed for this visit.  Visit Diagnosis:  Abnormality of gait  Postural instability  Bradykinesia  Festinating gait      Subjective Assessment - 02/25/15 0731    Subjective Hallucinations are continuing this week-Richard Mcgee is aware.  No falls per pt report and caregiver agrees.   Currently in Pain? Yes   Pain Score 9    Pain Location Back   Pain Orientation Right;Left   Pain Descriptors / Indicators Aching   Pain Type Acute pain   Pain Onset 1 to 4 weeks ago   Pain Frequency Constant   Aggravating Factors  too much exercise   Pain Relieving Factors reclined sitting alleviates                          [facility] Adult PT Treatment/Exercise - 02/25/15 0731    Transfers   Transfers Sit to Stand;Stand to Sit   Sit to Stand 5: Supervision   Stand to Sit 5: Supervision   Number of Reps 10 reps   Ambulation/Gait   Ambulation/Gait Yes   Ambulation/Gait Assistance 5: Supervision   Ambulation Distance (Feet) 200 Feet  x 2   Assistive device 4-wheeled walker   Gait Pattern Step-through pattern;Decreased step length - left;Narrow base of support   Timed Up and Go Test   TUG Normal TUG   Normal TUG (seconds) 36.18  1st trial no cues; 2nd trial > 1 minute with difficulty turn        Self Care:  Discussed with patient and caregiver:  Fall prevention within home environment, highlighting again tips to reduce freezing episodes with gait, with need for cueing to patient from family/caregiver.  Reviewed importance of continuing HEP and walking for exercise.  Discussed return to PT in approximately 6 months for evaluation for additional therapy needs, should functional mobility status warrant.        PT Education - 02/25/15 0736    Education provided Yes   Education Details fall prevention, review of techniques to reduce freezing with gait; progress towards goals and discharge   Person(s)  Educated Patient;Caregiver(s)   Methods Explanation;Handout   Comprehension Verbalized understanding          PT Short Term Goals - 01/07/15 0939    PT SHORT TERM GOAL #1   Title Pt will perform HEP with family/caregiver supervision, for improved transfers, gait, balance.  Target 01/06/15   Status Achieved   PT SHORT TERM GOAL #2   Title Pt will perform at least 8 of 10 reps of sit<>stand transfers with minimal to no UE support for improved transfer efficiency.   Baseline Requires min guard assistance and cues for momentum   Status Partially Met   PT SHORT TERM GOAL #3   Title Pt will improve TUG score to less than or equal to 45 seconds for decreased fall risk.   Baseline 01/07/15:  TUG 28.74 seconds    Status Achieved   PT SHORT TERM GOAL #4   Title Pt/family/caregiver will verbalize understanding of techniques to reduce freezing with gait and turns.   Baseline caregiver has been present during most treatment sessions for education/carryover   Status Achieved   PT SHORT TERM GOAL #5   Title Pt will improve Berg Balance score to at least 22/56 for decreased fall risk.   Baseline Berg 34/56 on 01/07/15   Status Achieved           PT Long Term Goals - 02/23/15 0946    PT LONG TERM GOAL #1   Title Pt/family/caregiver will verbalize understanding of fall prevention within the home environment.  Target 02/05/15  NEW TARGET DATE 03/04/15   Time 4   Period Weeks   Status Achieved   PT LONG TERM GOAL #2   Title Pt will perform 360 turns in less than 30 seconds for improved turning efficiency and safety. MOdified:  with cues; NEW TARGET 03/04/15   Baseline 38.13 sec using 4-wheeled RW   Time 4   Period Weeks   Status Not Met   PT LONG TERM GOAL #3   Title Pt will improve TUG score to less than or equal to 30 seconds for decreased fall risk.-met 01/07/15-Goal modified:  improve TUG score to less than or equal to 20 seconds.  GOAL MOdified 02/02/15:  TUG < or equal to 30 seconds with cues.  NEW TARGET 03/04/15   Baseline 36.18 sec with 4-wheeled RW 1st trial; 2nd trial > 1 minute   Time 8   Period Weeks   Status Not Met   PT LONG TERM GOAL #4   Title Pt will improve Berg score to at least 27/56 for decreased fall risk.01/07/15-met-modified goal:  improved TUG to at least 39/56 for decr. fall risk.   Baseline 02/02/15 Richard Mcgee 43/56   Time 8   Period Weeks   Status Achieved   PT LONG TERM GOAL #5   Title Pt will improve gait velocity to at least 2.62 ft/sec for improved gait efficiency and safety.   Baseline 02/02/15 gait velocity 2.4 ft/sec   Time 8   Period Weeks   Status Not Met   PT LONG TERM GOAL #6   Title Pt/caregiver will verbalize plans for optimall continued fitness upon D/C from PT.  TARGET  03/04/15   Time 4   Period Weeks   Status Achieved               Plan - 02/24/15 1203    Clinical Impression Statement Pt has met LTG# 1, LTG #6.  LTG #2 and 3 not met due to increased episodes  of freezing with turns.  LTG #4-5 previously assessed. Pt continues to respond best with mobility when he has cueing with gait.  [name] appropriate for D/C from PT.   Pt will benefit from skilled therapeutic intervention in order to improve on the following deficits Abnormal gait;Decreased balance;Decreased mobility;Decreased safety awareness;Difficulty walking;Decreased strength;Impaired flexibility;Postural dysfunction   Rehab Potential Good   PT Frequency 1x / week   PT Duration 4 weeks   PT Treatment/Interventions ADLs/Self Care Home Management;Gait training;DME Instruction;Functional mobility training;Therapeutic activities;Therapeutic exercise;Balance training;Neuromuscular re-education;Patient/family education   PT Next Visit Plan Pt appropriate for D/C today.   Consulted and Agree with Plan of Care Patient;Family member/caregiver        Problem List Patient Active Problem List   Diagnosis Date Noted  . 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 Visual hallucinations 11/12/2014  . Renal cell carcinoma of right kidney metastatic to other site 11/11/2014  . Parkinson's disease (tremor, stiffness, slow motion, unstable posture) 11/11/2014  . CKD (chronic kidney disease), stage III 11/11/2014    Patsye Sullivant W. 02/25/2015, 7:37 AM  Frazier Butt., PT [facility] [address] [place], [place], [address] Phone: [phone]   Fax:  [phone]   PHYSICAL THERAPY DISCHARGE SUMMARY  Visits from Start of Care: 14  Current functional level related to goals / functional outcomes: See goal status above   Remaining deficits: Festination, freezing of gait, decreased posture and balance -improves briefly with  cueing.  [name] having hallucinations recently as well.   Education / Equipment: Pt/caregiver educated in [employer], fall prevention, tips to reduce freezing with gait, community fitness options beyond PT.  Plan: Patient agrees to discharge.  Patient goals were partially met. Patient is being discharged due to                                                     ?????maximizing rehab potential at this time.  Caregivers and family able to provide appropriate cueing and assistance with exercise and mobility.  PT recommends that pt return for physical therapy evaluation in 6-9 months, to address any changing functional mobility needs.

## 2015-02-24 NOTE — Telephone Encounter (Signed)
FYI- patient does not want to start Nuplazid at this time.

## 2015-03-01 ENCOUNTER — Ambulatory Visit (INDEPENDENT_AMBULATORY_CARE_PROVIDER_SITE_OTHER): Payer: Medicare Other | Admitting: Neurology

## 2015-03-01 ENCOUNTER — Encounter: Payer: Self-pay | Admitting: Neurology

## 2015-03-01 VITALS — BP 120/82 | HR 62 | Ht 66.0 in | Wt 144.0 lb

## 2015-03-01 DIAGNOSIS — R441 Visual hallucinations: Secondary | ICD-10-CM | POA: Diagnosis not present

## 2015-03-01 DIAGNOSIS — R440 Auditory hallucinations: Secondary | ICD-10-CM | POA: Diagnosis not present

## 2015-03-01 DIAGNOSIS — F0280 Dementia in other diseases classified elsewhere without behavioral disturbance: Secondary | ICD-10-CM

## 2015-03-01 DIAGNOSIS — G20 Parkinson's disease: Secondary | ICD-10-CM

## 2015-03-01 NOTE — Progress Notes (Signed)
Richard Mcgee was seen today in the movement disorders clinic for neurologic consultation at the request of Dr. Darlen Round.  The consultation is for the evaluation of PD.  The records that were made available to me were reviewed.    Earliest records were from 2013, but it appears that the patient had seen him for longer than that.  This patient is accompanied in the office by his spouse and daughter in law who supplements the history.     The patient's first symptom that he can recall was right hand tremor.  Pts wife states he was initially dx with ET but he was later dx with PD.  They estimate that this was 5 years ago.  He was initially on no medication.  The patient has been on carbidopa/levodopa 25/100 since September, 2013.  In July, 2014 amantadine was added because of dyskinesia but by his follow-up visit in October, 2014 this medication had been discontinued.  Records do not make it clear why this was discontinued by the patient but the patients wife states that he had visual hallucinations.  In April 2015, Aricept was added but a subsequent note indicates that it was discontinued because of side effect, but does not indicate what the side effect was.  Pts wife/daughter in law states that he only took it once or twice but "he doesn't like to take medication."  [name] on carbidopa/levodopa 25/100, 2 at 6 am, 2 at noon, 2 at 5 pm.  He takes the noon dosage and the 5 pm dosage AFTER the meals.  Pt doesn't think that it helps but family reports that he tried to d/c a few weeks ago and sx's got worse (especially tremor).  The patient does not remember this.  The patient's wife does state that he was told by Dr. Darlen Round to take Benadryl as it could be helpful for Parkinson's disease.  The patient does not do this, but his wife wanted to know if it would be beneficial.  11/12/14 update:  The patient presents today for his on/off test.  He is accompanied by his daughter-in-law who supplements the history.  Since  our last visit, the patient has attended the [facility] and really has enjoyed it.  He is scheduled to start therapy on June 7.  He has not taken any of his levodopa since approximately 5:30 PM last night.  He has done worse this morning, but attributes that to the fact that he exercised vigorously and felt better yesterday.  He is currently generally taking his levodopa at 6 AM and then he will go back to sleep for 2 hours before he gets up for the day.  He then will take his next dose of medication at approximately noon and his last dose of medication at 5 PM.  02/11/15 update:  The patient is following up today regarding his Parkinson's disease and Parkinson's disease dementia.  He is accompanied by his wife who supplements the history.  He has levodopa resistant tremor.  His levodopa was increased last visit so that he is taking carbidopa/levodopa 25/100, 2 tablets at 6 AM/10 AM/2 PM/6 PM.  This was a total of 2 extra tablets per day.  While he does have some dyskinesia, his amantadine was discontinued some time ago because it increased hallucinations.  He was on Aricept in the past, but self discontinued that.  Pt states that hallucinations increased but just this week.  His walking is deteriorating over the last few weeks and  so has his speech.  No falls.  Caregivers are present 8-1pm and then 5-8pm, 7 days per week.  They have LTC insurance.  He is walking with walker all the time; that bothers him because pt wants to use the cane.  Paxil was just started about 6 weeks ago.  Not sure if helping mood.  03/01/15 update:  The patient is accompanied by his wife and daughter who supplements the history.  Last visit, the patient was having more hallucinations and we did some lab work, which was unremarkable.  We decided to hold his Paxil.  This was done on August 12.  I called him on August 22 and they reported the hallucinations were resolved.  On August 23, however, I got a message that hallucinations were back  and have been back ever since.  I offered Nuplazid, but they did not want the medication at the time.  His levodopa was increased by 2 tablets in May, but hallucinations did not start until July, at which point he was placed in the Paxil, which is why they associated it with that.  He used to be on Aricept, but self discontinued it as he generally does not like to be on medications.  The patient is also having auditory hallucinations where he hears the house alarm and it is not going off.  He doesn't have hearing aids.  He also sticks out his hand to shake people hands and they are not there.  He sees dogs and children in the house.    Neuroimaging has previously been performed.  Wife believes it was a CT brain done about 5 years ago.   It is not available for my review today.  PREVIOUS MEDICATIONS: Sinemet and Amantadine (hallucinations), given Aricept but did not take it because he does not "like pills."  ALLERGIES:   Allergies  Allergen Reactions  . Sulfa Antibiotics     CURRENT MEDICATIONS:  Outpatient Encounter Prescriptions as of 03/01/2015  Medication Sig  . acetaminophen-codeine (TYLENOL #3) 300-30 MG per tablet 1 tablet.  . carbidopa-levodopa (SINEMET IR) 25-100 MG per tablet Take 2 tablets by mouth 4 (four) times daily.  . Cholecalciferol 2000 UNITS CAPS Take 2,000 Units by mouth daily.  Marland Kitchen docusate sodium (COLACE) 50 MG capsule Take 50 mg by mouth 2 (two) times daily.  . Misc Natural Products (FIBER 7) POWD Take by mouth.  . Multiple Vitamin (MULTIVITAMIN) tablet Take 1 tablet by mouth daily.  . polyethylene glycol (MIRALAX / GLYCOLAX) packet Take 17 g by mouth daily.  . [DISCONTINUED] Multiple Vitamins-Minerals (ICAPS MV PO) Take by mouth daily.  . [DISCONTINUED] PARoxetine (PAXIL-CR) 12.5 MG 24 hr tablet Take 12.5 mg by mouth daily.   No facility-administered encounter medications on file as of 03/01/2015.    PAST MEDICAL HISTORY:   Past Medical History  Diagnosis Date  .  Hypertension   . GERD (gastroesophageal reflux disease)   . Parkinsonism   . Renal cell cancer 2010  . Degenerative disc disease, cervical   . Degenerative disc disease, lumbar   . Osteoarthritis   . Depression   . H/O rotator cuff tear   . BPH (benign prostatic hyperplasia)   . Renal insufficiency   . Lung nodules     PAST SURGICAL HISTORY:   Past Surgical History  Procedure Laterality Date  . Nephrectomy Right   . Total hip arthroplasty Bilateral   . Cervical laminectomy    . Cataract extraction, bilateral  2015    SOCIAL  HISTORY:   Social History   Social History  . Marital Status: Married    Spouse Name: N/A  . Number of Children: N/A  . Years of Education: N/A   Occupational History  . 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 Smoking status: Former Research scientist (life sciences)  . Smokeless tobacco: Never Used     Comment: quit 1970's  . Alcohol Use: 0.0 oz/week    0 Standard drinks or equivalent per week     Comment: 1 time every 4 weeks  . Drug Use: No  . Sexual Activity: Not on file   Other Topics Concern  . Not on file   Social History Narrative   Lives with wife in a one story home.  Has 2 children.  Retired from [employer].      FAMILY HISTORY:   Family Status  Relation Status Death Age  . Mother Deceased     CAD, DM, CHF  . Father Deceased     coal mine accident  . Brother Deceased     CA, renal cell  . Sister Deceased     liver CA  . Child Alive     2, alive and well    ROS:  A complete 10 system review of systems was obtained and was unremarkable apart from what is mentioned above.  The patient was seen in the office at approximately 10 AM and had last taken his medication at 6 AM  PHYSICAL EXAMINATION:    VITALS:   Filed Vitals:   03/01/15 0832  BP: 120/82  Pulse: 62  Height: 5\' 6"  (1.676 m)  Weight: 144 lb (65.318 kg)    GEN:  The patient  appears stated age and is in NAD. HEENT:  Normocephalic, atraumatic.  The mucous membranes are moist. The superficial temporal arteries are without ropiness or tenderness. CV:  RRR Lungs:  CTAB Neck/HEME:  There are no carotid bruits bilaterally.  Neurological examination:  Orientation: The patient is alert and oriented to person and place, but had some difficulty with specifics on time.  He talks about seeing others in the room that are not present. Sensation: Sensation is intact to light touch throughout. Motor: Strength is 5/5 in the bilateral upper and lower extremities.   Shoulder shrug is equal and symmetric.  There is no pronator drift.   Movement examination: Tone: There is increased tone in the right upper extremity  Abnormal movements: There is moderate to severe resting tremor bilaterally, but it is more persistent on the right than the left. Coordination: There is decremation with RAM's, seen with finger taps and alternating supination/pronation of the forearm. Gait and Station: The patient has significant difficulty arising out of a deep-seated chair without the use of the hands and is unable to do this at all. He is able to get out of the chair without assistance today, but ambulates with his walker down the hall.  He has start hesitation and the right foot sticks to the floor.  Once he is out in the hallway with his walker, he actually walks quite well and the stride length is better.  He has difficulty with turns.     Chemistry      Component Value Date/Time   NA 143 02/11/2015 1042   K 4.7 02/11/2015 1042   CL 107 02/11/2015 1042   CO2 19* 02/11/2015 1042   BUN 41*  02/11/2015 1042   CREATININE 1.79* 02/11/2015 1042   CREATININE 2.1* 06/23/2009 2309      Component Value Date/Time   CALCIUM 8.4* 02/11/2015 1042   ALKPHOS 68 02/11/2015 1042   AST 11 02/11/2015 1042   ALT 4* 02/11/2015 1042   BILITOT 0.6 02/11/2015 1042       ASSESSMENT/PLAN:  1.  Idiopathic  Parkinson's disease, diagnosed since approximately 2011 and starting on levodopa in September, 2013.  -The patient's disease course has been complicated by hallucinations, cognitive impairment and postural instability.  -A UPDRS motor on/off test was done  11/12/14, which demonstrated that the patient does have levodopa resistant tremor, but that the levodopa was modestly helpful for rigidity and not all that helpful for gait and balance.  He is to continue 2 tablets tid from 2 po qid to see if that helps hallucinations. 2.  Hallucinations  -As above, his levodopa was increased by 200 mg per day total in May, but hallucinations really did not increase until July.  I told them we could back down on the levodopa and see if that helped, or we could add Nuplazid.  I talked to them extensively about the risks/benefits/side effects of the medication including the black box warning.  We decided to drop levodopa for the next 2 weeks.  He also has renal insufficiency with creatinine clearance of 27 and so would need dose adjusted of only 17 mg a day as opposed to the normal 34mg /day.  Understood (family did) that this was not studied in study population but also that other atypical antipsychotics have risks as well.  Willing to try nuplazid if dropping levodopa not helpful.  Risks, benefits, side effects and alternative therapies were discussed.  Daughter denies any issues with QT prolongation in past.  Is very knowlegdeable re: pts medical hx.  The opportunity to ask questions was given and they were answered to the best of my ability.  Much greater than 50% of this visit was spent in counseling with the patient and the family.  Total face to face time:  35 min 2.  Sialorrhea  -Overall mild and while we discussed Myobloc, they are not interested right now. 3.  Parkinson's dementia  -He was on Aricept but has discontinued it.  He does not like to take medications.  Fortunately, he does have caregivers and safety has  been addressed in the home. 4.  Renal insufficiency  - This is being monitored by his PCP and urology.   5.  Lung nodule  - He has metastatic renal cell that has been fairly slow to progress and they are just watching this. 6.  Dyskinesia  -Did not see any today.  Tried amantadine in the past but increased hallucinations.  7.  I will plan on seeing him back in the next few months

## 2015-03-01 NOTE — Patient Instructions (Signed)
1. Decrease Levodopa to 2 tablets three times daily - 30 minutes before meals.

## 2015-03-01 NOTE — Progress Notes (Signed)
Note routed to Dr Stoneking.  

## 2015-03-18 ENCOUNTER — Telehealth: Payer: Self-pay | Admitting: Neurology

## 2015-03-18 NOTE — Telephone Encounter (Signed)
Spoke with patient's wife - she states hallucinations have gone away with decrease of Levodopa and patient is doing well. They will call if needed.

## 2015-04-20 ENCOUNTER — Encounter (HOSPITAL_COMMUNITY): Payer: Self-pay | Admitting: *Deleted

## 2015-04-20 ENCOUNTER — Emergency Department (HOSPITAL_COMMUNITY)
Admission: EM | Admit: 2015-04-20 | Discharge: 2015-04-20 | Disposition: A | Discharge status: Home / Self Care | Payer: Medicare Other | Attending: Emergency Medicine | Admitting: Emergency Medicine

## 2015-04-20 ENCOUNTER — Emergency Department (HOSPITAL_COMMUNITY): Payer: Medicare Other

## 2015-04-20 DIAGNOSIS — Z87448 Personal history of other diseases of urinary system: Secondary | ICD-10-CM | POA: Insufficient documentation

## 2015-04-20 DIAGNOSIS — Z87891 Personal history of nicotine dependence: Secondary | ICD-10-CM | POA: Diagnosis not present

## 2015-04-20 DIAGNOSIS — S51002A Unspecified open wound of left elbow, initial encounter: Secondary | ICD-10-CM | POA: Insufficient documentation

## 2015-04-20 DIAGNOSIS — Z87438 Personal history of other diseases of male genital organs: Secondary | ICD-10-CM | POA: Diagnosis not present

## 2015-04-20 DIAGNOSIS — Y9389 Activity, other specified: Secondary | ICD-10-CM | POA: Diagnosis not present

## 2015-04-20 DIAGNOSIS — S51001A Unspecified open wound of right elbow, initial encounter: Secondary | ICD-10-CM | POA: Insufficient documentation

## 2015-04-20 DIAGNOSIS — Z8659 Personal history of other mental and behavioral disorders: Secondary | ICD-10-CM | POA: Insufficient documentation

## 2015-04-20 DIAGNOSIS — W19XXXA Unspecified fall, initial encounter: Secondary | ICD-10-CM

## 2015-04-20 DIAGNOSIS — I10 Essential (primary) hypertension: Secondary | ICD-10-CM | POA: Insufficient documentation

## 2015-04-20 DIAGNOSIS — S0181XA Laceration without foreign body of other part of head, initial encounter: Secondary | ICD-10-CM | POA: Insufficient documentation

## 2015-04-20 DIAGNOSIS — Y9289 Other specified places as the place of occurrence of the external cause: Secondary | ICD-10-CM | POA: Insufficient documentation

## 2015-04-20 DIAGNOSIS — S0191XA Laceration without foreign body of unspecified part of head, initial encounter: Secondary | ICD-10-CM

## 2015-04-20 DIAGNOSIS — G20 Parkinson's disease: Secondary | ICD-10-CM | POA: Insufficient documentation

## 2015-04-20 DIAGNOSIS — Y998 Other external cause status: Secondary | ICD-10-CM | POA: Insufficient documentation

## 2015-04-20 DIAGNOSIS — Z79899 Other long term (current) drug therapy: Secondary | ICD-10-CM | POA: Diagnosis not present

## 2015-04-20 DIAGNOSIS — W1839XA Other fall on same level, initial encounter: Secondary | ICD-10-CM | POA: Insufficient documentation

## 2015-04-20 DIAGNOSIS — Z8719 Personal history of other diseases of the digestive system: Secondary | ICD-10-CM | POA: Diagnosis not present

## 2015-04-20 DIAGNOSIS — M1990 Unspecified osteoarthritis, unspecified site: Secondary | ICD-10-CM | POA: Diagnosis not present

## 2015-04-20 DIAGNOSIS — S0990XA Unspecified injury of head, initial encounter: Secondary | ICD-10-CM | POA: Diagnosis present

## 2015-04-20 DIAGNOSIS — Z85528 Personal history of other malignant neoplasm of kidney: Secondary | ICD-10-CM | POA: Insufficient documentation

## 2015-04-20 MED ORDER — LIDOCAINE-EPINEPHRINE 1 %-1:100000 IJ SOLN
20.0000 mL | Freq: Once | INTRAMUSCULAR | Status: AC
  Administered 2015-04-20: 20 mL via INTRADERMAL
  Filled 2015-04-20: qty 1

## 2015-04-20 MED ORDER — ACETAMINOPHEN 500 MG PO TABS: 1000.0000 mg | ORAL_TABLET | Freq: Once | ORAL | Status: DC

## 2015-04-20 NOTE — Discharge Instructions (Signed)
Stitches, Staples, or Adhesive Wound Closure Doctors use stitches (sutures), staples, and certain glue (skin adhesives) to hold your skin together while it heals (wound closure). You may need this treatment after you have surgery or if you cut your skin accidentally. These methods help your skin heal more quickly. They also make it less likely that you will have a scar. WHAT ARE THE DIFFERENT KINDS OF WOUND CLOSURES? There are many options for wound closure. The one that your doctor uses depends on how deep and large your wound is. Adhesive Glue To use this glue to close a wound, your doctor holds the edges of the wound together and paints the glue on the surface of your skin. You may need more than one layer of glue. Then the wound may be covered with a light bandage (dressing). This type of skin closure may be used for small wounds that are not deep (superficial). Using glue for wound closure is less painful than other methods. It does not require a medicine that numbs the area. This method also leaves nothing to be removed. Adhesive glue is often used for children and on facial wounds. Adhesive glue cannot be used for wounds that are deep, uneven, or bleeding. It is not used inside of a wound.  Adhesive Strips These strips are made of sticky (adhesive), porous paper. They are placed across your skin edges like a regular adhesive bandage. You leave them on until they fall off. Adhesive strips may be used to close very superficial wounds. They may also be used along with sutures to improve closure of your skin edges.  Sutures Sutures are the oldest method of wound closure. Sutures can be made from natural or synthetic materials. They can be made from a material that your body can break down as your wound heals (absorbable), or they can be made from a material that needs to be removed from your skin (nonabsorbable). They come in many different strengths and sizes. Your doctor attaches the sutures to a  steel needle on one end. Sutures can be passed through your skin, or through the tissues beneath your skin. Then they are tied and cut. Your skin edges may be closed in one continuous stitch or in separate stitches. Sutures are strong and can be used for all kinds of wounds. Absorbable sutures may be used to close tissues under the skin. The disadvantage of sutures is that they may cause skin reactions that lead to infection. Nonabsorbable sutures need to be removed. Staples When surgical staples are used to close a wound, the edges of your skin on both sides of the wound are brought close together. A staple is placed across the wound, and an instrument secures the edges together. Staples are often used to close surgical cuts (incisions). Staples are faster to use than sutures, and they cause less reaction from your skin. Staples need to be removed using a tool that bends the staples away from your skin. HOW DO I CARE FOR MY WOUND CLOSURE?  Take medicines only as told by your doctor.  If you were prescribed an antibiotic medicine for your wound, finish it all even if you start to feel better.  Use ointments or creams only as told by your doctor.  Wash your hands with soap and water before and after touching your wound.  Do not soak your wound in water. Do not take baths, swim, or use a hot tub until your doctor says it is okay.  Ask your doctor when  water. Do not take baths, swim, or use a hot tub until your doctor says it is okay.  · Ask your doctor when you can start showering. Cover your wound if told by your doctor.  · Do not take out your own sutures or staples.  · Do not pick at your wound. Picking can cause an infection.  · Keep all follow-up visits as told by your doctor. This is important.  HOW LONG WILL I HAVE MY WOUND CLOSURE?   · Leave adhesive glue on your skin until the glue peels away.  · Leave adhesive strips on your skin until they fall off.  · Absorbable sutures will dissolve within several days.  · Nonabsorbable sutures and staples must be removed. The location of the wound will  determine how long they stay in. This can range from several days to a couple of weeks.  WHEN SHOULD I SEEK HELP FOR MY WOUND CLOSURE?  Contact your doctor if:  · You have a fever.  · You have chills.  · You have redness, puffiness (swelling), or pain at the site of your wound.  · You have fluid, blood, or pus coming from your wound.  · There is a bad smell coming from your wound.  · The skin edges of your wound start to separate after your sutures have been removed.  · Your wound becomes thick, raised, and darker in color after your sutures come out (scarring).     This information is not intended to replace advice given to you by your health care provider. Make sure you discuss any questions you have with your health care provider.     Document Released: 04/15/2009 Document Revised: 07/09/2014 Document Reviewed: 11/25/2013  Elsevier Interactive Patient Education ©2016 [employer]

## 2015-04-20 NOTE — ED Notes (Signed)
Patient is alert and oriented x3.  He was given DC instructions and follow up visit instructions.  Patient gave verbal understanding.  He was DC ambulatory under his own power to home.  V/S stable.  He was not showing any signs of distress on DC 

## 2015-04-20 NOTE — ED Provider Notes (Signed)
CSN: [id number]     Arrival date & time 04/20/15  6967 History   First MD Initiated Contact with Patient 04/20/15 [phone]     Chief Complaint  Patient presents with  . Fall     (Consider location/radiation/quality/duration/timing/severity/associated sxs/prior Treatment) Patient is a 81 y.o. male presenting with fall. The history is provided by the patient.  Fall This is a new problem. The current episode started less than 1 hour ago. The problem occurs every several days. The problem has not changed since onset.Associated symptoms include headaches. Pertinent negatives include no chest pain, no abdominal pain and no shortness of breath. Nothing aggravates the symptoms. Nothing relieves the symptoms. He has tried nothing for the symptoms. The treatment provided no relief.    81 yo M with a chief complaint of a fall. Patient states that he had tangled in his pajamas and fell forward onto his face. Patient denies loss of consciousness denies neck pain. Patient has a history of Parkinson's disease as well as spinal stenosis which he has chronic paresthesias. He denies any worsening of these. Patient denies any chest pain abdominal pain. Having some pain to bilateral elbows as well as bilateral feet. Patient states that his bilateral foot pain is chronic and unchanged.   Past Medical History  Diagnosis Date  . Hypertension   . GERD (gastroesophageal reflux disease)   . Parkinsonism ([place])   . Renal cell cancer ([place]) 2010  . Degenerative disc disease, cervical   . Degenerative disc disease, lumbar   . Osteoarthritis   . Depression   . H/O rotator cuff tear   . BPH (benign prostatic hyperplasia)   . Renal insufficiency   . Lung nodules    Past Surgical History  Procedure Laterality Date  . Nephrectomy Right   . Total hip arthroplasty Bilateral   . Cervical laminectomy    . Cataract extraction, bilateral  2015   Family History  Problem Relation Age of Onset  . CAD    . Hypertension     . CVA    . Diabetes    . Kidney cancer    . Depression     Social History  Substance Use Topics  . Smoking status: Former Research scientist (life sciences)  . Smokeless tobacco: Never Used     Comment: quit 1970's  . Alcohol Use: No     Comment: 1 time every 4 weeks    Review of Systems  Constitutional: Negative for fever and chills.  HENT: Negative for congestion and facial swelling.   Eyes: Negative for discharge and visual disturbance.  Respiratory: Negative for shortness of breath.   Cardiovascular: Negative for chest pain and palpitations.  Gastrointestinal: Negative for nausea, vomiting, abdominal pain and diarrhea.  Musculoskeletal: Negative for myalgias and arthralgias.  Skin: Positive for wound (bilateral skin tears to elbows, lac above R eye). Negative for color change and rash.  Neurological: Positive for tremors (parkinsons), numbness (chronic from spinal stenosis, unchanged) and headaches. Negative for syncope.  Psychiatric/Behavioral: Negative for confusion and dysphoric mood.      Allergies  Sulfa antibiotics  Home Medications   Prior to Admission medications   Medication Sig Start Date End Date Taking? Authorizing Provider  acetaminophen-codeine (TYLENOL #3) 300-30 MG per tablet Take 1 tablet by mouth every 6 (six) hours as needed for moderate pain.    Yes Historical Provider, MD  carbidopa-levodopa (SINEMET IR) 25-100 MG per tablet Take 2 tablets by mouth 4 (four) times daily. Patient taking differently: Take 2 tablets by  mouth 3 (three) times daily.  11/12/14  Yes Rebecca S Tat, DO  Cholecalciferol 2000 UNITS CAPS Take 2,000 Units by mouth daily.   Yes Historical Provider, MD  docusate sodium (COLACE) 50 MG capsule Take 50 mg by mouth 2 (two) times daily.   Yes Historical Provider, MD  Misc Natural Products (FIBER 7) POWD Take 1 packet by mouth daily.    Yes Historical Provider, MD  Multiple Vitamin (MULTIVITAMIN) tablet Take 1 tablet by mouth daily.   Yes Historical Provider, MD   polyethylene glycol (MIRALAX / GLYCOLAX) packet Take 17 g by mouth daily as needed for moderate constipation.    Yes Historical Provider, MD   BP 226/86 mmHg  Pulse 69  Temp(Src) 97.7 F (36.5 C) (Oral)  Resp 18  SpO2 98% Physical Exam  Constitutional: He is oriented to person, place, and time. He appears well-developed and well-nourished.  HENT:  Head: Normocephalic and atraumatic.  4.5 cm lac to right forehead above eye brow  Eyes: EOM are normal. Pupils are equal, round, and reactive to light.  Neck: Normal range of motion. Neck supple. No JVD present.  Cardiovascular: Normal rate and regular rhythm.  Exam reveals no gallop and no friction rub.   No murmur heard. Pulmonary/Chest: No respiratory distress. He has no wheezes.  Abdominal: He exhibits no distension. There is no tenderness. There is no rebound and no guarding.  Musculoskeletal: Normal range of motion. He exhibits no edema or tenderness.  Small skin tears to bilateral lateral aspects of the elbows  Neurological: He is alert and oriented to person, place, and time. He displays tremor.  Mild left ptosis  Skin: No rash noted. No pallor.  Psychiatric: He has a normal mood and affect. His behavior is normal.  Nursing note and vitals reviewed.   ED Course  .Marland KitchenLaceration Repair Date/Time: 04/20/2015 2:55 PM Performed by: Tyrone Nine Snyder Colavito Authorized by: Deno Etienne Consent: Verbal consent obtained. Risks and benefits: risks, benefits and alternatives were discussed Consent given by: patient Required items: required blood products, implants, devices, and special equipment available Patient identity confirmed: verbally with patient Time out: Immediately prior to procedure a "time out" was called to verify the correct patient, procedure, equipment, support staff and site/side marked as required. Body area: head/neck Location details: forehead Laceration length: 5 cm Tendon involvement: none Nerve involvement: none Vascular  damage: no Anesthesia: local infiltration Local anesthetic: lidocaine 1% with epinephrine Anesthetic total: 7 ml Patient sedated: no Preparation: Patient was prepped and draped in the usual sterile fashion. Irrigation solution: saline Irrigation method: syringe Amount of cleaning: extensive Debridement: none Degree of undermining: none Skin closure: 4-0 nylon Number of sutures: 5 Technique: simple Approximation: close Approximation difficulty: simple Dressing: 4x4 sterile gauze Patient tolerance: Patient tolerated the procedure well with no immediate complications   (including critical care time) Labs Review Labs Reviewed - No data to display  Imaging Review Ct Head Wo Contrast  04/20/2015  CLINICAL DATA:  Laceration over the right eye. Left arm laceration. Status post possible fall. EXAM: CT HEAD WITHOUT CONTRAST CT CERVICAL SPINE WITHOUT CONTRAST TECHNIQUE: Multidetector CT imaging of the head and cervical spine was performed following the standard protocol without intravenous contrast. Multiplanar CT image reconstructions of the cervical spine were also generated. COMPARISON:  None. FINDINGS: CT HEAD FINDINGS There is no evidence of mass effect, midline shift, or extra-axial fluid collections. There is no evidence of a space-occupying lesion or intracranial hemorrhage. There is no evidence of a cortical-based area of acute  infarction. 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 There is generalized cerebral atrophy. There is periventricular white matter low attenuation likely secondary to microangiopathy. The ventricles and sulci are appropriate for the patient's age. The basal cisterns are patent. Visualized portions of the orbits are unremarkable. The visualized portions of the paranasal sinuses and mastoid air cells are unremarkable. Cerebrovascular atherosclerotic calcifications are noted. The osseous structures are unremarkable. There is a small right frontal scalp hematoma. CT  CERVICAL SPINE FINDINGS The alignment is anatomic. The vertebral body heights are maintained. There is no acute fracture. There is no static listhesis. The prevertebral soft tissues are normal. The intraspinal soft tissues are not fully imaged on this examination due to poor soft tissue contrast, but there is no gross soft tissue abnormality. There is degenerative disc disease with disc height loss at C6-7. There is osseous fusion across the C5-6 disc space and posterior elements. There is bilateral mild facet arthropathy at C2-3. There is moderate bilateral facet arthropathy at C3-4. There is broad osseous ridging at C5-6 eccentric towards the right with moderate right foraminal stenosis. There is a broad-based disc osteophyte complex at C6-7. The visualized portions of the lung apices demonstrate no focal abnormality. There is bilateral carotid artery atherosclerosis. IMPRESSION: 1. No acute intracranial pathology. 2. No acute osseous injury of the cervical spine. 3. Cervical spine spondylosis as described above. 4. Small right frontal scalp hematoma. 5. Old left occipital lobe infarct. Electronically Signed   By: Kathreen Devoid   On: 04/20/2015 09:58   Ct Cervical Spine Wo Contrast  04/20/2015  CLINICAL DATA:  Laceration over the right eye. Left arm laceration. Status post possible fall. EXAM: CT HEAD WITHOUT CONTRAST CT CERVICAL SPINE WITHOUT CONTRAST TECHNIQUE: Multidetector CT imaging of the head and cervical spine was performed following the standard protocol without intravenous contrast. Multiplanar CT image reconstructions of the cervical spine were also generated. COMPARISON:  None. FINDINGS: CT HEAD FINDINGS There is no evidence of mass effect, midline shift, or extra-axial fluid collections. There is no evidence of a space-occupying lesion or intracranial hemorrhage. There is no evidence of a cortical-based area of acute infarction. 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 There is generalized  cerebral atrophy. There is periventricular white matter low attenuation likely secondary to microangiopathy. The ventricles and sulci are appropriate for the patient's age. The basal cisterns are patent. Visualized portions of the orbits are unremarkable. The visualized portions of the paranasal sinuses and mastoid air cells are unremarkable. Cerebrovascular atherosclerotic calcifications are noted. The osseous structures are unremarkable. There is a small right frontal scalp hematoma. CT CERVICAL SPINE FINDINGS The alignment is anatomic. The vertebral body heights are maintained. There is no acute fracture. There is no static listhesis. The prevertebral soft tissues are normal. The intraspinal soft tissues are not fully imaged on this examination due to poor soft tissue contrast, but there is no gross soft tissue abnormality. There is degenerative disc disease with disc height loss at C6-7. There is osseous fusion across the C5-6 disc space and posterior elements. There is bilateral mild facet arthropathy at C2-3. There is moderate bilateral facet arthropathy at C3-4. There is broad osseous ridging at C5-6 eccentric towards the right with moderate right foraminal stenosis. There is a broad-based disc osteophyte complex at C6-7. The visualized portions of the lung apices demonstrate no focal abnormality. There is bilateral carotid artery atherosclerosis. IMPRESSION: 1. No acute intracranial pathology. 2. No acute osseous injury of the cervical spine. 3. Cervical  spine spondylosis as described above. 4. Small right frontal scalp hematoma. 5. Old left occipital lobe infarct. Electronically Signed   By: Kathreen Devoid   On: 04/20/2015 09:58   Dg Hip Unilat With Pelvis 2-3 Views Right  04/20/2015  CLINICAL DATA:  Fall at home this morning.  Laceration to the head. EXAM: DG HIP (WITH OR WITHOUT PELVIS) 2-3V RIGHT COMPARISON:  None. FINDINGS: There are bilateral total hip arthroplasties. There is no fracture or  dislocation. There is no hardware failure or complication. There is generalized osteopenia. There are mild degenerative changes of bilateral SI joints and lower lumbar spine. IMPRESSION: No acute osseous injury of the right hip. Electronically Signed   By: Kathreen Devoid   On: 04/20/2015 10:00   I have personally reviewed and evaluated these images and lab results as part of my medical decision-making.   EKG Interpretation None      MDM   Final diagnoses:  Fall, initial encounter  Laceration of head, initial encounter    81 yo M with a chief complaint of a fall. Laceration repaired at bedside. We'll obtain a CT head CT C-spine.   CT head and C spine negative, d/c home.   I have discussed the diagnosis/risks/treatment options with the patient and family and believe the pt to be eligible for discharge home to follow-up with PCP. We also discussed returning to the ED immediately if new or worsening sx occur. We discussed the sx which are most concerning (e.g., sudden worsening pain, aloc) that necessitate immediate return. Medications administered to the patient during their visit and any new prescriptions provided to the patient are listed below.  Medications given during this visit Medications  lidocaine-EPINEPHrine (XYLOCAINE W/EPI) 1 %-1:100000 (with pres) injection 20 mL (20 mLs Intradermal Given by Other 04/20/15 0959)    Discharge Medication List as of 04/20/2015 10:32 AM      The patient appears reasonably screen and/or stabilized for discharge and I doubt any other medical condition or other [facility] requiring further screening, evaluation, or treatment in the ED at this time prior to discharge.     Deno Etienne, DO 04/20/15 1456

## 2015-04-20 NOTE — ED Notes (Signed)
Bed: UP73 Expected date:  Expected time:  Means of arrival:  Comments: Ems- elderly fall

## 2015-04-20 NOTE — ED Notes (Signed)
EMS called to found patient in bathroom.  Patient has laceration over right eye and left arm. No LOC.  Patient stated son states that patient has parkinson and has gait issues.

## 2015-04-26 ENCOUNTER — Telehealth: Payer: Self-pay | Admitting: Neurology

## 2015-04-26 NOTE — Telephone Encounter (Signed)
Pt daughter [name] would like to talk to someone about starting a new drug for parkinson but it treats dementia too. Please call her after 10:15 she has a dr appt [phone]

## 2015-04-27 NOTE — Telephone Encounter (Signed)
Spoke with patient's daughter and she states he has been getting very agitated and confused in the afternoons. He has been doing a lot of mumbling to himself about things that aren't there. She doesn't know if this is hallucinations or just agitation. She is interested in him trying samples of Nuplazid to see if this helps. Please advise.

## 2015-04-27 NOTE — Telephone Encounter (Signed)
Left message on machine for patient's daughter to call back.   

## 2015-04-27 NOTE — Telephone Encounter (Signed)
Make an appt for next week to discuss.  Can't start it over phone anyway because of ppwrk but won't because want to discuss black box warning.

## 2015-04-28 ENCOUNTER — Other Ambulatory Visit: Payer: Self-pay | Admitting: *Deleted

## 2015-04-28 DIAGNOSIS — C641 Malignant neoplasm of right kidney, except renal pelvis: Secondary | ICD-10-CM

## 2015-04-28 DIAGNOSIS — N183 Chronic kidney disease, stage 3 unspecified: Secondary | ICD-10-CM

## 2015-04-28 DIAGNOSIS — C799 Secondary malignant neoplasm of unspecified site: Principal | ICD-10-CM

## 2015-04-28 NOTE — Telephone Encounter (Signed)
Spoke with patient's daughter and he has appt on 05/18/2015. She would like to wait til that appt to discuss. If things get worse she will call back and move appt sooner.

## 2015-05-02 ENCOUNTER — Other Ambulatory Visit: Payer: Self-pay | Admitting: Oncology

## 2015-05-02 ENCOUNTER — Other Ambulatory Visit (HOSPITAL_BASED_OUTPATIENT_CLINIC_OR_DEPARTMENT_OTHER): Payer: Medicare Other

## 2015-05-02 ENCOUNTER — Ambulatory Visit (HOSPITAL_COMMUNITY)
Admission: RE | Admit: 2015-05-02 | Discharge: 2015-05-02 | Disposition: A | Discharge status: Home / Self Care | Payer: Medicare Other | Source: Ambulatory Visit | Attending: Oncology | Admitting: Oncology

## 2015-05-02 DIAGNOSIS — N183 Chronic kidney disease, stage 3 unspecified: Secondary | ICD-10-CM

## 2015-05-02 DIAGNOSIS — C799 Secondary malignant neoplasm of unspecified site: Principal | ICD-10-CM

## 2015-05-02 DIAGNOSIS — R918 Other nonspecific abnormal finding of lung field: Secondary | ICD-10-CM | POA: Diagnosis present

## 2015-05-02 DIAGNOSIS — C7970 Secondary malignant neoplasm of unspecified adrenal gland: Secondary | ICD-10-CM

## 2015-05-02 DIAGNOSIS — R911 Solitary pulmonary nodule: Secondary | ICD-10-CM | POA: Insufficient documentation

## 2015-05-02 DIAGNOSIS — C7800 Secondary malignant neoplasm of unspecified lung: Secondary | ICD-10-CM

## 2015-05-02 DIAGNOSIS — I517 Cardiomegaly: Secondary | ICD-10-CM | POA: Insufficient documentation

## 2015-05-02 DIAGNOSIS — C641 Malignant neoplasm of right kidney, except renal pelvis: Secondary | ICD-10-CM | POA: Diagnosis present

## 2015-05-02 LAB — CBC WITH DIFFERENTIAL/PLATELET
BASO%: 1 % (ref 0.0–2.0)
BASOS ABS: 0.1 10*3/uL (ref 0.0–0.1)
EOS%: 0.9 % (ref 0.0–7.0)
Eosinophils Absolute: 0.1 10*3/uL (ref 0.0–0.5)
HCT: 36.9 % — ABNORMAL LOW (ref 38.4–49.9)
HEMOGLOBIN: 12.2 g/dL — AB (ref 13.0–17.1)
LYMPH%: 10.2 % — ABNORMAL LOW (ref 14.0–49.0)
MCH: 31.8 pg (ref 27.2–33.4)
MCHC: 33 g/dL (ref 32.0–36.0)
MCV: 96.3 fL (ref 79.3–98.0)
MONO#: 0.8 10*3/uL (ref 0.1–0.9)
MONO%: 9.2 % (ref 0.0–14.0)
NEUT%: 78.7 % — ABNORMAL HIGH (ref 39.0–75.0)
NEUTROS ABS: 7.3 10*3/uL — AB (ref 1.5–6.5)
PLATELETS: 212 10*3/uL (ref 140–400)
RBC: 3.83 10*6/uL — ABNORMAL LOW (ref 4.20–5.82)
RDW: 13.7 % (ref 11.0–14.6)
WBC: 9.2 10*3/uL (ref 4.0–10.3)
lymph#: 0.9 10*3/uL (ref 0.9–3.3)

## 2015-05-02 LAB — COMPREHENSIVE METABOLIC PANEL (CC13)
ALBUMIN: 3.7 g/dL (ref 3.5–5.0)
ALK PHOS: 98 U/L (ref 40–150)
ALT: <9 U/L (ref 0–55)
ANION GAP: 7 meq/L (ref 3–11)
AST: 10 U/L (ref 5–34)
BILIRUBIN TOTAL: 0.44 mg/dL (ref 0.20–1.20)
BUN: 40.9 mg/dL — ABNORMAL HIGH (ref 7.0–26.0)
CALCIUM: 9.1 mg/dL (ref 8.4–10.4)
CO2: 24 mEq/L (ref 22–29)
Chloride: 111 mEq/L — ABNORMAL HIGH (ref 98–109)
Creatinine: 1.9 mg/dL — ABNORMAL HIGH (ref 0.7–1.3)
EGFR: 32 mL/min/{1.73_m2} — AB (ref >90)
GLUCOSE: 114 mg/dL (ref 70–140)
POTASSIUM: 5.1 meq/L (ref 3.5–5.1)
Sodium: 142 mEq/L (ref 136–145)
TOTAL PROTEIN: 6.3 g/dL — AB (ref 6.4–8.3)

## 2015-05-06 ENCOUNTER — Telehealth: Payer: Self-pay | Admitting: Oncology

## 2015-05-06 NOTE — Telephone Encounter (Signed)
returned call and s.w. pt and confirmed NOV appt...ok and aware

## 2015-05-09 ENCOUNTER — Other Ambulatory Visit: Payer: Self-pay

## 2015-05-09 ENCOUNTER — Telehealth: Payer: Self-pay | Admitting: *Deleted

## 2015-05-09 ENCOUNTER — Other Ambulatory Visit: Payer: Self-pay | Admitting: Oncology

## 2015-05-09 ENCOUNTER — Ambulatory Visit (HOSPITAL_COMMUNITY)
Admission: RE | Admit: 2015-05-09 | Discharge: 2015-05-09 | Disposition: A | Discharge status: Home / Self Care | Payer: Medicare Other | Source: Ambulatory Visit | Attending: Oncology | Admitting: Oncology

## 2015-05-09 DIAGNOSIS — C641 Malignant neoplasm of right kidney, except renal pelvis: Secondary | ICD-10-CM | POA: Diagnosis present

## 2015-05-09 DIAGNOSIS — R918 Other nonspecific abnormal finding of lung field: Secondary | ICD-10-CM | POA: Diagnosis not present

## 2015-05-09 DIAGNOSIS — C649 Malignant neoplasm of unspecified kidney, except renal pelvis: Secondary | ICD-10-CM

## 2015-05-09 DIAGNOSIS — R1031 Right lower quadrant pain: Secondary | ICD-10-CM

## 2015-05-09 DIAGNOSIS — I700 Atherosclerosis of aorta: Secondary | ICD-10-CM | POA: Diagnosis not present

## 2015-05-09 DIAGNOSIS — I2510 Atherosclerotic heart disease of native coronary artery without angina pectoris: Secondary | ICD-10-CM | POA: Insufficient documentation

## 2015-05-09 DIAGNOSIS — C799 Secondary malignant neoplasm of unspecified site: Principal | ICD-10-CM

## 2015-05-09 MED ORDER — IOHEXOL 300 MG/ML  SOLN
50.0000 mL | Freq: Once | INTRAMUSCULAR | Status: DC | PRN
  Administered 2015-05-09: 50 mL via ORAL
  Filled 2015-05-09: qty 50

## 2015-05-09 NOTE — Telephone Encounter (Signed)
Order for CT Abdomen Pelvis was put in so many times we need to make sure they want to have this test done or what test di they want done.  This nurse advised it was a duplicate order but yes, this CT abdomen Pelvis is what provider wants ordered for this patient.Marland Kitchen

## 2015-05-10 ENCOUNTER — Ambulatory Visit (HOSPITAL_BASED_OUTPATIENT_CLINIC_OR_DEPARTMENT_OTHER): Payer: Medicare Other | Admitting: Oncology

## 2015-05-10 ENCOUNTER — Telehealth: Payer: Self-pay | Admitting: Oncology

## 2015-05-10 VITALS — BP 113/50 | HR 63 | Temp 97.7°F | Resp 18 | Ht 66.0 in | Wt 144.1 lb

## 2015-05-10 DIAGNOSIS — R918 Other nonspecific abnormal finding of lung field: Secondary | ICD-10-CM

## 2015-05-10 DIAGNOSIS — G20 Parkinson's disease: Secondary | ICD-10-CM

## 2015-05-10 DIAGNOSIS — R1031 Right lower quadrant pain: Secondary | ICD-10-CM

## 2015-05-10 DIAGNOSIS — C641 Malignant neoplasm of right kidney, except renal pelvis: Secondary | ICD-10-CM

## 2015-05-10 DIAGNOSIS — C649 Malignant neoplasm of unspecified kidney, except renal pelvis: Secondary | ICD-10-CM

## 2015-05-10 DIAGNOSIS — N183 Chronic kidney disease, stage 3 (moderate): Secondary | ICD-10-CM | POA: Diagnosis not present

## 2015-05-10 MED ORDER — TRAMADOL HCL 50 MG PO TABS: 50.0000 mg | ORAL_TABLET | Freq: Four times a day (QID) | ORAL | Status: DC | PRN

## 2015-05-10 MED ORDER — CARBIDOPA-LEVODOPA 25-100 MG PO TABS: 2.0000 | ORAL_TABLET | Freq: Three times a day (TID) | ORAL | Status: AC

## 2015-05-10 NOTE — Progress Notes (Signed)
Richard Mcgee  Telephone:([phone]) [phone] Fax:(336) [phone]     ID: Richard Mcgee DOB: [birth year]  MR#: [id number]  QAE#:[id number]  Patient Care Team: Lajean Manes, MD as PCP - General (Internal Medicine) Raynelle Bring, MD as Consulting Physician (Urology) Chauncey Cruel, MD as Consulting Physician (Oncology) Ludwig Clarks, DO as Consulting Physician (Neurology) PCP: Mathews Argyle, MD OTHER MD:  CHIEF COMPLAINT: stage 4 clear cell renal carcinoma  CURRENT TREATMENT: observation   HISTORY OF PRESENT ILLNESS: From the prior summary:  Mr. Richard Mcgee") has a history of renal cell carcinoma dating back to 06/20/2009 when he underwent right nephrectomy under Dr. Alinda Money 4 clear cell renal carcinoma, grade 2, measuring 9 cm. The tumor was confined to the kidney and margins were negative SZB ( [id number]). I do not have copies of any staging studies obtained at that time, but in September 2012 the patient was found to have bilateral lung lesions and a new 1.5 cm( right) adrenal mass. Treatment was discussed with the patient, but he chose observation, and over the last several years the largest measurable nodule, in the left lower lung, has increased there is slowly. By 07/28/2013 on CT scan it measured 2.2 cm. In August 2015 by chest x-ray it measured 3.2 cm, and when rechecked 08/21/2014 again by chest x-ray it had not changed at all, again measuring 3.2 cm.  His subsequent history is as detailed below.  INTERVAL HISTORY: Mr. Richard Mcgee returns today for follow-up of his kidney cancer accompanied by his wife. The interval history is significant for having developed what he describes as a right lower quadrant pain. For that reason we obtained a noncontrast CT of the abdomen and pelvis. This shows his lung mass to have grown from 0.7 cm to 1.4 cm all over. Of 3-1/2 years. It also shows a new adrenal mass, again having risen over the past 3-1/2 years basically what this tells [place] is  that his renal cell cancer is growing in about 2 mm per year. It also does not suggest that the cancer has anything to do with the pain he is currently experiencing.  The CT scan does show a compression fracture at T12. Since the pain is positional, worse with sitting in certain positions and also when bending forward, this could be a cause of the change.  REVIEW OF SYSTEMS: Advait continues to be troubled by Parkinson's and recently his Sinemet dose had to be decreased because he was "hallucinating" according to his wife. He is doing now better as far as that is concerned. He is very bothered by the tremor because he used to play the piano and he can no longer do that. He used to run and love that and he can no longer do that. This is a constant source of frustration for him quite aside from that he is constipated or tells me his stool is "dry". He does not drinks enough during the day. He says. He is on multiple stool softeners to help with this. He feels forgetful but not depressed. A detailed review of systems today was otherwise stable.  PAST MEDICAL HISTORY: Past Medical History  Diagnosis Date  . Hypertension   . GERD (gastroesophageal reflux disease)   . Parkinsonism ([place])   . Renal cell cancer ([place]) 2010  . Degenerative disc disease, cervical   . Degenerative disc disease, lumbar   . Osteoarthritis   . Depression   . H/O rotator cuff tear   . BPH (benign prostatic hyperplasia)   .  Renal insufficiency   . Lung nodules     PAST SURGICAL HISTORY: Past Surgical History  Procedure Laterality Date  . Nephrectomy Right   . Total hip arthroplasty Bilateral   . Cervical laminectomy    . Cataract extraction, bilateral  2015    FAMILY HISTORY Family History  Problem Relation Age of Onset  . CAD    . Hypertension    . CVA    . Diabetes    . Kidney cancer    . Depression    the patient's father died at the age of 9, 56 years after minding accident. The patient's mother died at  the age of 15 from complications of diabetes. The patient had one brother, who had kidney cancer as well as prostate cancer. He had one sister who had liver cancer. Both of them were diagnosed in their 45s. The patient has a nephew diagnosed with kidney cancer in his 67s, and a cousin and A maternalaunt both diagnosed with kidney cancer in their 48s  SOCIAL HISTORY:  Syler used to work in [employer], and he was in [employer] when he met his wife of 60+ years, Richard Mcgee "Richard Mcgee". He was playing the plan at the [employer] and saw her and decided he was going to [place] her.Their 2 sons are Richard Mcgee, who is a Conservation officer, historic buildings here in [place], and Richard Mcgee, who is an attorney here in [place]. The patient has 2 grandchildren. He is not a church attender    ADVANCED DIRECTIVES: living Will is in place. The patient's wife is his healthcare power of attorney   HEALTH MAINTENANCE: Social History  Substance Use Topics  . Smoking status: Former Research scientist (life sciences)  . Smokeless tobacco: Never Used     Comment: quit 1970's  . Alcohol Use: No     Comment: 1 time every 4 weeks     Colonoscopy:  PSA:  Bone density:  Lipid panel:  Allergies  Allergen Reactions  . Sulfa Antibiotics     unknown    Current Outpatient Prescriptions  Medication Sig Dispense Refill  . acetaminophen-codeine (TYLENOL #3) 300-30 MG per tablet Take 1 tablet by mouth every 6 (six) hours as needed for moderate pain.     . carbidopa-levodopa (SINEMET IR) 25-100 MG per tablet Take 2 tablets by mouth 4 (four) times daily. (Patient taking differently: Take 2 tablets by mouth 3 (three) times daily. ) 720 tablet 1  . Cholecalciferol 2000 UNITS CAPS Take 2,000 Units by mouth daily.    Marland Kitchen docusate sodium (COLACE) 50 MG capsule Take 50 mg by mouth 2 (two) times daily.    . Misc Natural Products (FIBER 7) POWD Take 1 packet by mouth daily.     . Multiple Vitamin (MULTIVITAMIN) tablet Take 1 tablet by mouth daily.    . polyethylene glycol (MIRALAX /  GLYCOLAX) packet Take 17 g by mouth daily as needed for moderate constipation.      No current facility-administered medications for this visit.    OBJECTIVE: older white man who appears stated age 82 Vitals:   05/10/15 1035  BP: 113/50  Pulse: 63  Temp: 97.7 F (36.5 C)  Resp: 18     Body mass index is 23.27 kg/(m^2).    ECOG FS:2 - Symptomatic, <50% confined to bed  Sclerae unicteric, EOMs intact Oropharynx clear, dentition in good repair No cervical or supraclavicular adenopathy Lungs no rales or rhonchi Heart regular rate and rhythm Abd soft, nontender including palpation of the lower quadrants, positive bowel  sounds, no masses palpated MSK  focal spinal tenderness at approximately T12/L1, no lymphedema Neuro: Consistent with Parkinson's, with resting tremor and some facial masking, but well oriented, appropriate affect   LAB RESULTS:  CMP     Component Value Date/Time   NA 142 05/02/2015 1034   NA 143 02/11/2015 1042   K 5.1 05/02/2015 1034   K 4.7 02/11/2015 1042   CL 107 02/11/2015 1042   CO2 24 05/02/2015 1034   CO2 19* 02/11/2015 1042   GLUCOSE 114 05/02/2015 1034   GLUCOSE 93 02/11/2015 1042   BUN 40.9* 05/02/2015 1034   BUN 41* 02/11/2015 1042   CREATININE 1.9* 05/02/2015 1034   CREATININE 1.79* 02/11/2015 1042   CREATININE 2.1* 06/23/2009 2309   CALCIUM 9.1 05/02/2015 1034   CALCIUM 8.4* 02/11/2015 1042   PROT 6.3* 05/02/2015 1034   PROT 6.1 02/11/2015 1042   ALBUMIN 3.7 05/02/2015 1034   ALBUMIN 3.8 02/11/2015 1042   AST 10 05/02/2015 1034   AST 11 02/11/2015 1042   ALT <9 05/02/2015 1034   ALT 4* 02/11/2015 1042   ALKPHOS 98 05/02/2015 1034   ALKPHOS 68 02/11/2015 1042   BILITOT 0.44 05/02/2015 1034   BILITOT 0.6 02/11/2015 1042   GFRNONAA 35* 06/23/2009 0450   GFRAA * 06/23/2009 0450    43        The eGFR has been calculated using the MDRD equation. This calculation has not been validated in all clinical situations. eGFR's  persistently <60 mL/min signify possible Chronic Kidney Disease.    INo results found for: SPEP, UPEP  Lab Results  Component Value Date   WBC 9.2 05/02/2015   NEUTROABS 7.3* 05/02/2015   HGB 12.2* 05/02/2015   HCT 36.9* 05/02/2015   MCV 96.3 05/02/2015   PLT 212 05/02/2015      Chemistry      Component Value Date/Time   NA 142 05/02/2015 1034   NA 143 02/11/2015 1042   K 5.1 05/02/2015 1034   K 4.7 02/11/2015 1042   CL 107 02/11/2015 1042   CO2 24 05/02/2015 1034   CO2 19* 02/11/2015 1042   BUN 40.9* 05/02/2015 1034   BUN 41* 02/11/2015 1042   CREATININE 1.9* 05/02/2015 1034   CREATININE 1.79* 02/11/2015 1042   CREATININE 2.1* 06/23/2009 2309      Component Value Date/Time   CALCIUM 9.1 05/02/2015 1034   CALCIUM 8.4* 02/11/2015 1042   ALKPHOS 98 05/02/2015 1034   ALKPHOS 68 02/11/2015 1042   AST 10 05/02/2015 1034   AST 11 02/11/2015 1042   ALT <9 05/02/2015 1034   ALT 4* 02/11/2015 1042   BILITOT 0.44 05/02/2015 1034   BILITOT 0.6 02/11/2015 1042       No results found for: LABCA2  No components found for: LYYTK354  No results for input(s): INR in the last 168 hours.  Urinalysis    Component Value Date/Time   COLORURINE YELLOW 02/11/2015 [place] 02/11/2015 1042   LABSPEC 1.024 02/11/2015 1042   PHURINE 5.5 02/11/2015 1042   GLUCOSEU NEGATIVE 02/11/2015 1042   HGBUR NEGATIVE 02/11/2015 1042   BILIRUBINUR NEGATIVE 02/11/2015 1042   KETONESUR TRACE* 02/11/2015 1042   PROTEINUR 2+* 02/11/2015 1042   UROBILINOGEN 0.2 06/23/2009 2313   NITRITE NEGATIVE 02/11/2015 1042   LEUKOCYTESUR NEGATIVE 02/11/2015 1042    STUDIES: Ct Abdomen Pelvis Wo Contrast  05/09/2015  CLINICAL DATA:  Renal cell carcinoma. EXAM: CT ABDOMEN AND PELVIS WITHOUT CONTRAST TECHNIQUE: Multidetector CT imaging  of the abdomen and pelvis was performed following the standard protocol without IV contrast. COMPARISON:  09/21/2011 FINDINGS: Lower chest: No pleural  effusion. Pulmonary nodule in the left lower lobe measures 1.4 cm. Previously 0.7 cm. Right middle lobe pulmonary nodule mesh 0.8 cm, image 1 of series 4. Previously 0.4 cm. The heart size is enlarged. There is thoracic aortic calcification. Calcifications involving the LAD, left circumflex and RCA coronary arteries also noted. Hepatobiliary: No suspicious liver abnormality. The gallbladder is normal. There is no biliary dilatation. Pancreas: The pancreas is unremarkable. Spleen: The spleen is negative. Adrenals/Urinary Tract: Nodule in the right adrenal gland measures 1.3 cm and is unchanged from previous exam, image 18 of series 2. New adjacent nodule measures 1.4 cm, image 60 of series 602. Stable appearance of the nodular appearing left adrenal gland. Previous right nephrectomy. Nonobstructing left renal calculi are identified measuring up to 8 mm. There is a hyperdense lesion arising from the inferior pole of the left kidney which measures 11/07/1936 GU and 1.4 cm, image 31 of series 2. Urinary bladder is largely obscured secondary to beam hardening artifact from bilateral hip arthroplasty devices. Stomach/Bowel: The stomach is normal. The small bowel loops have a normal course and caliber without obstruction. Normal appearance of the colon. Vascular/Lymphatic: Calcified atherosclerotic disease involves the abdominal aorta. No aneurysm. No enlarged retroperitoneal or mesenteric adenopathy. No enlarged pelvic or inguinal lymph nodes. Reproductive: The prostate gland appears enlarged. Other: No free fluid or fluid collections identified. Musculoskeletal: The bones appear diffusely osteopenic. T12 compression deformity is noted with loss of approximately 30% of the vertebral body height. This is new from previous exam. IMPRESSION: 1. Pulmonary nodules in the right middle lobe and left lower lobe have increased in size from the previous exam and are worrisome for metastatic disease. 2. New nodule adjacent to the  right adrenal gland is identified and also suspicious for recurrence of tumor/metastatic disease. 3. Aortic atherosclerosis and multi vessel coronary artery calcification. 4. Lumbar spondylosis with new T12 compression deformity. Electronically Signed   By: Kerby Moors M.D.   On: 05/09/2015 14:40   Dg Chest 2 View  05/02/2015  CLINICAL DATA:  Lung mass. EXAM: CHEST  2 VIEW COMPARISON:  Chest x-ray 11/11/2014.  Chest CT 09/21/2011 . FINDINGS: Mediastinum and hilar structures normal. Nodular density in the left mid lung is stable from prior exams. Tiny stable right apical nodules are noted. Reference is made to prior CT report for discussion of pulmonary nodules. Cardiomegaly with normal pulmonary vascularity. No pleural effusion or pneumothorax. No acute bony abnormality. Degenerative changes thoracic spine . IMPRESSION: 1.  Stable left mid lung field pulmonary nodule. 2. Mild cardiomegaly. No pulmonary venous congestion. No acute cardiopulmonary disease. Electronically Signed   By: Marcello Moores  Register   On: 05/02/2015 11:24   Ct Head Wo Contrast  04/20/2015  CLINICAL DATA:  Laceration over the right eye. Left arm laceration. Status post possible fall. EXAM: CT HEAD WITHOUT CONTRAST CT CERVICAL SPINE WITHOUT CONTRAST TECHNIQUE: Multidetector CT imaging of the head and cervical spine was performed following the standard protocol without intravenous contrast. Multiplanar CT image reconstructions of the cervical spine were also generated. COMPARISON:  None. FINDINGS: CT HEAD FINDINGS There is no evidence of mass effect, midline shift, or extra-axial fluid collections. There is no evidence of a space-occupying lesion or intracranial hemorrhage. There is no evidence of a cortical-based area of acute infarction. 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 There is generalized cerebral atrophy. There is  periventricular white matter low attenuation likely secondary to microangiopathy. The ventricles and sulci are  appropriate for the patient's age. The basal cisterns are patent. Visualized portions of the orbits are unremarkable. The visualized portions of the paranasal sinuses and mastoid air cells are unremarkable. Cerebrovascular atherosclerotic calcifications are noted. The osseous structures are unremarkable. There is a small right frontal scalp hematoma. CT CERVICAL SPINE FINDINGS The alignment is anatomic. The vertebral body heights are maintained. There is no acute fracture. There is no static listhesis. The prevertebral soft tissues are normal. The intraspinal soft tissues are not fully imaged on this examination due to poor soft tissue contrast, but there is no gross soft tissue abnormality. There is degenerative disc disease with disc height loss at C6-7. There is osseous fusion across the C5-6 disc space and posterior elements. There is bilateral mild facet arthropathy at C2-3. There is moderate bilateral facet arthropathy at C3-4. There is broad osseous ridging at C5-6 eccentric towards the right with moderate right foraminal stenosis. There is a broad-based disc osteophyte complex at C6-7. The visualized portions of the lung apices demonstrate no focal abnormality. There is bilateral carotid artery atherosclerosis. IMPRESSION: 1. No acute intracranial pathology. 2. No acute osseous injury of the cervical spine. 3. Cervical spine spondylosis as described above. 4. Small right frontal scalp hematoma. 5. Old left occipital lobe infarct. Electronically Signed   By: Kathreen Devoid   On: 04/20/2015 09:58   Ct Cervical Spine Wo Contrast  04/20/2015  CLINICAL DATA:  Laceration over the right eye. Left arm laceration. Status post possible fall. EXAM: CT HEAD WITHOUT CONTRAST CT CERVICAL SPINE WITHOUT CONTRAST TECHNIQUE: Multidetector CT imaging of the head and cervical spine was performed following the standard protocol without intravenous contrast. Multiplanar CT image reconstructions of the cervical spine were also  generated. COMPARISON:  None. FINDINGS: CT HEAD FINDINGS There is no evidence of mass effect, midline shift, or extra-axial fluid collections. There is no evidence of a space-occupying lesion or intracranial hemorrhage. There is no evidence of a cortical-based area of acute infarction. 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 There is generalized cerebral atrophy. There is periventricular white matter low attenuation likely secondary to microangiopathy. The ventricles and sulci are appropriate for the patient's age. The basal cisterns are patent. Visualized portions of the orbits are unremarkable. The visualized portions of the paranasal sinuses and mastoid air cells are unremarkable. Cerebrovascular atherosclerotic calcifications are noted. The osseous structures are unremarkable. There is a small right frontal scalp hematoma. CT CERVICAL SPINE FINDINGS The alignment is anatomic. The vertebral body heights are maintained. There is no acute fracture. There is no static listhesis. The prevertebral soft tissues are normal. The intraspinal soft tissues are not fully imaged on this examination due to poor soft tissue contrast, but there is no gross soft tissue abnormality. There is degenerative disc disease with disc height loss at C6-7. There is osseous fusion across the C5-6 disc space and posterior elements. There is bilateral mild facet arthropathy at C2-3. There is moderate bilateral facet arthropathy at C3-4. There is broad osseous ridging at C5-6 eccentric towards the right with moderate right foraminal stenosis. There is a broad-based disc osteophyte complex at C6-7. The visualized portions of the lung apices demonstrate no focal abnormality. There is bilateral carotid artery atherosclerosis. IMPRESSION: 1. No acute intracranial pathology. 2. No acute osseous injury of the cervical spine. 3. Cervical spine spondylosis as described above. 4. Small right frontal scalp hematoma. 5. Old left occipital lobe  infarct. Electronically Signed   By: Kathreen Devoid   On: 04/20/2015 09:58   Dg Hip Unilat With Pelvis 2-3 Views Right  04/20/2015  CLINICAL DATA:  Fall at home this morning.  Laceration to the head. EXAM: DG HIP (WITH OR WITHOUT PELVIS) 2-3V RIGHT COMPARISON:  None. FINDINGS: There are bilateral total hip arthroplasties. There is no fracture or dislocation. There is no hardware failure or complication. There is generalized osteopenia. There are mild degenerative changes of bilateral SI joints and lower lumbar spine. IMPRESSION: No acute osseous injury of the right hip. Electronically Signed   By: Kathreen Devoid   On: 04/20/2015 10:00     ASSESSMENT: 81 y.o. [place] man s/p Right nephrectomy 06/20/2009 for a T2a N0 MX, clear cell renal carcinoma  (1) progression in multiple lung lesions and a new adrenal lesion noted September 2012, followed off treatment since  (2) Parkinson's disease  (3) DDD with history of compression fractures but negative lumbar spine MRI 11/18/2012  (a) s/p cervical laminectomy  (b) s/p bilateral THR  (c) Hx trauma to Left clavicle (fall)  (d) spinal stenosis  (4) CKD stage 3  PLAN: I explained to Mr. Bartles that I don't think his kidney cancer is at the root of the pain he is experiencing. The chest x-ray we have been using to follow his lung nodule is unchanged. The CT scan of the abdomen and pelvis we obtained does show growth of the lung nodule and also a new nodule and the adrenal gland, but this scan is being compared to a scan from 3-1/2 years ago. The growth rate of the lung mass is 2 mm per year or less. This is actually quite reassuring.  The scan does show a compression fracture at T12. I discussed this with Jermon the patient's son. He tells me that compression fracture is not new. Accordingly he does not think proceeding to kyphoplasty or similar procedures would be useful at this point.  We left it that Mr. Mcauley is going to start tramadol 50 mg 4 times  a day as needed for pain. He can also take plain Tylenol as needed. Because the tramadol will further constipate him I have suggested to drink a quart and a half of liquid daily. He is going to measure a quart of water every morning and drink that report by noon. He will didn't drink another half port during the rest of the day.  His son Deral is going to check with Dr. Rolena Infante in orthopedics, who will review the CT results and also the films they have their and see of there has been any change. They may consider a nerve block as needed. The epidural that Mr. Farias had 2 weeks ago does not seem to have worked.  Otherwise I will see Mr. Chipman again in 3 months. We will repeat lab work and a chest x-ray again before that visit. Of course I will be glad to see him at any point before then if the need arises  Chauncey Cruel, MD   05/10/2015 10:55 AM Medical Oncology and Hematology [facility] [place], [place] [address] Tel. [phone]    Fax. [phone]

## 2015-05-10 NOTE — Telephone Encounter (Signed)
Appointments made and avs patient

## 2015-05-18 ENCOUNTER — Encounter: Payer: Self-pay | Admitting: Neurology

## 2015-05-18 ENCOUNTER — Ambulatory Visit (INDEPENDENT_AMBULATORY_CARE_PROVIDER_SITE_OTHER): Payer: Medicare Other | Admitting: Neurology

## 2015-05-18 VITALS — BP 112/60 | HR 67 | Ht 66.0 in | Wt 143.0 lb

## 2015-05-18 DIAGNOSIS — R441 Visual hallucinations: Secondary | ICD-10-CM | POA: Diagnosis not present

## 2015-05-18 DIAGNOSIS — F0281 Dementia in other diseases classified elsewhere with behavioral disturbance: Secondary | ICD-10-CM | POA: Diagnosis not present

## 2015-05-18 DIAGNOSIS — G20 Parkinson's disease: Secondary | ICD-10-CM | POA: Diagnosis not present

## 2015-05-18 NOTE — Progress Notes (Signed)
Richard Mcgee was seen today in the movement disorders clinic for neurologic consultation at the request of Dr. Darlen Round.  The consultation is for the evaluation of PD.  The records that were made available to me were reviewed.    Earliest records were from 2013, but it appears that the patient had seen him for longer than that.  This patient is accompanied in the office by his spouse and daughter in law who supplements the history.     The patient's first symptom that he can recall was right hand tremor.  Pts wife states he was initially dx with ET but he was later dx with PD.  They estimate that this was 5 years ago.  He was initially on no medication.  The patient has been on carbidopa/levodopa 25/100 since September, 2013.  In July, 2014 amantadine was added because of dyskinesia but by his follow-up visit in October, 2014 this medication had been discontinued.  Records do not make it clear why this was discontinued by the patient but the patients wife states that he had visual hallucinations.  In April 2015, Aricept was added but a subsequent note indicates that it was discontinued because of side effect, but does not indicate what the side effect was.  Pts wife/daughter in law states that he only took it once or twice but "he doesn't like to take medication."  [name] on carbidopa/levodopa 25/100, 2 at 6 am, 2 at noon, 2 at 5 pm.  He takes the noon dosage and the 5 pm dosage AFTER the meals.  Pt doesn't think that it helps but family reports that he tried to d/c a few weeks ago and sx's got worse (especially tremor).  The patient does not remember this.  The patient's wife does state that he was told by Dr. Darlen Round to take Benadryl as it could be helpful for Parkinson's disease.  The patient does not do this, but his wife wanted to know if it would be beneficial.  11/12/14 update:  The patient presents today for his on/off test.  He is accompanied by his daughter-in-law who supplements the history.  Since  our last visit, the patient has attended the [facility] and really has enjoyed it.  He is scheduled to start therapy on June 7.  He has not taken any of his levodopa since approximately 5:30 PM last night.  He has done worse this morning, but attributes that to the fact that he exercised vigorously and felt better yesterday.  He is currently generally taking his levodopa at 6 AM and then he will go back to sleep for 2 hours before he gets up for the day.  He then will take his next dose of medication at approximately noon and his last dose of medication at 5 PM.  02/11/15 update:  The patient is following up today regarding his Parkinson's disease and Parkinson's disease dementia.  He is accompanied by his wife who supplements the history.  He has levodopa resistant tremor.  His levodopa was increased last visit so that he is taking carbidopa/levodopa 25/100, 2 tablets at 6 AM/10 AM/2 PM/6 PM.  This was a total of 2 extra tablets per day.  While he does have some dyskinesia, his amantadine was discontinued some time ago because it increased hallucinations.  He was on Aricept in the past, but self discontinued that.  Pt states that hallucinations increased but just this week.  His walking is deteriorating over the last few weeks and  so has his speech.  No falls.  Caregivers are present 8-1pm and then 5-8pm, 7 days per week.  They have LTC insurance.  He is walking with walker all the time; that bothers him because pt wants to use the cane.  Paxil was just started about 6 weeks ago.  Not sure if helping mood.  03/01/15 update:  The patient is accompanied by his wife and daughter who supplements the history.  Last visit, the patient was having more hallucinations and we did some lab work, which was unremarkable.  We decided to hold his Paxil.  This was done on August 12.  I called him on August 22 and they reported the hallucinations were resolved.  On August 23, however, I got a message that hallucinations were back  and have been back ever since.  I offered Nuplazid, but they did not want the medication at the time.  His levodopa was increased by 2 tablets in May, but hallucinations did not start until July, at which point he was placed in the Paxil, which is why they associated it with that.  He used to be on Aricept, but self discontinued it as he generally does not like to be on medications.  The patient is also having auditory hallucinations where he hears the house alarm and it is not going off.  He doesn't have hearing aids.  He also sticks out his hand to shake people hands and they are not there.  He sees dogs and children in the house.    05/18/15 update:  The patient is following up today, accompanied by his wife who supplements the history.  The patient has a history of Parkinson's disease which is complicated by levodopa resistant tremor as well as memory changes and hallucinations.  Last visit I decreased his carbidopa/levodopa 25/100 from 2 tablets 4 times a day to 2 tablets 3 times a day because of hallucinations.  I called them after I did this and his wife reported that hallucinations were gone.  Therefore, we decided to hold on Nuplazid which we had talked about last visit.  His daughter did end up calling me recently on 04/27/2015 and asked me about starting Nuplazid because of increased agitation and aggression, and she was not sure if this was a result of hallucinations or not.  Pt states that he has had "uninvited guests at dinner."  Wife states that hallucinations are not nearly as severe as before I decreased the levodopa.  She states that it isn't that bothersome to him. Pt reports it may only happen every 5 days and wife agrees.  His wife states that he doesn't get agitated that much -  only when nagged to "pick up feet or take medication."  They do have daily caregivers that "exercise him."    I reviewed his records since our last visit.  He went to the emergency room on 04/20/2015 after a fall.  He  was getting dressed and got tangled in his clothing (trying to take pants off over shoes) and fell face forward.  He had a laceration on his forehead that was repaired.  He had a CT of the brain that I reviewed.  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  He has seen oncology since our last visit in regards to his renal cell cancer.  His lung mass is growing a rate of less than 2 mm per year.  He did start him on  tramadol 50 mg 4 times a day for his back pain.  He previously saw Dr. Nelva Bush on 04/08/2015 and had another injection in his back but he was told that he felt that most of the symptoms were from Parkinson's disease and not from his back.  He is seeing ortho next week for a possible MRI back.    PREVIOUS MEDICATIONS: Sinemet and Amantadine (hallucinations), given Aricept but did not take it because he does not "like pills."  ALLERGIES:   Allergies  Allergen Reactions  . Sulfa Antibiotics     unknown    CURRENT MEDICATIONS:  Outpatient Encounter Prescriptions as of 05/18/2015  Medication Sig  . carbidopa-levodopa (SINEMET IR) 25-100 MG tablet Take 2 tablets by mouth 3 (three) times daily.  . Cholecalciferol 2000 UNITS CAPS Take 2,000 Units by mouth daily.  Marland Kitchen docusate sodium (COLACE) 50 MG capsule Take 50 mg by mouth 2 (two) times daily.  . Misc Natural Products (FIBER 7) POWD Take 1 packet by mouth daily.   . Multiple Vitamin (MULTIVITAMIN) tablet Take 1 tablet by mouth daily.  . polyethylene glycol (MIRALAX / GLYCOLAX) packet Take 17 g by mouth daily as needed for moderate constipation.   . [DISCONTINUED] traMADol (ULTRAM) 50 MG tablet Take 1 tablet (50 mg total) by mouth every 6 (six) hours as needed.   No facility-administered encounter medications on file as of 05/18/2015.    PAST MEDICAL HISTORY:   Past Medical History  Diagnosis Date  . Hypertension   . GERD (gastroesophageal reflux disease)   . Parkinsonism  ([place])   . Renal cell cancer ([place]) 2010  . Degenerative disc disease, cervical   . Degenerative disc disease, lumbar   . Osteoarthritis   . Depression   . H/O rotator cuff tear   . BPH (benign prostatic hyperplasia)   . Renal insufficiency   . Lung nodules     PAST SURGICAL HISTORY:   Past Surgical History  Procedure Laterality Date  . Nephrectomy Right   . Total hip arthroplasty Bilateral   . Cervical laminectomy    . Cataract extraction, bilateral  2015    SOCIAL HISTORY:   Social History   Social History  . Marital Status: Married    Spouse Name: N/A  . Number of Children: N/A  . Years of Education: N/A   Occupational History  . 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 Smoking status: Former Research scientist (life sciences)  . Smokeless tobacco: Never Used     Comment: quit 1970's  . Alcohol Use: No     Comment: 1 time every 4 weeks  . Drug Use: No  . Sexual Activity: Not on file   Other Topics Concern  . Not on file   Social History Narrative   Lives with wife in a one story home.  Has 2 children.  Retired from [employer].      FAMILY HISTORY:   Family Status  Relation Status Death Age  . Mother Deceased     CAD, DM, CHF  . Father Deceased     coal mine accident  . Brother Deceased     CA, renal cell  . Sister Deceased     liver CA  . Child Alive     2, alive and well    ROS:  A complete 10 system review of systems was obtained and was unremarkable apart from what  is mentioned above.  The patient was seen in the office at approximately 10 AM and had last taken his medication at 6 AM  PHYSICAL EXAMINATION:    VITALS:   Filed Vitals:   05/18/15 0927  BP: 112/60  Pulse: 67  Height: 5\' 6"  (1.676 m)  Weight: 143 lb (64.864 kg)    GEN:  The patient appears stated age and is in NAD. HEENT:  Normocephalic, atraumatic.  The mucous membranes are moist. The superficial  temporal arteries are without ropiness or tenderness. CV:  RRR Lungs:  CTAB Neck/HEME:  There are no carotid bruits bilaterally.  Neurological examination:  Orientation: The patient is alert and oriented to person and place, but had some difficulty with specifics on time.  He talks about seeing others in the room that are not present. Sensation: Sensation is intact to light touch throughout. Motor: Strength is 5/5 in the bilateral upper and lower extremities.   Shoulder shrug is equal and symmetric.  There is no pronator drift.   Movement examination: Tone: There is mild increased tone in the bilateral upper extremities  Abnormal movements: There is moderate to severe resting tremor bilaterally, but it is more persistent on the right than the left. Coordination: There is decremation with RAM's, seen with finger taps and alternating supination/pronation of the forearm. Gait and Station: The patient has significant difficulty arising out of a deep-seated chair without the use of the hands and is unable to do this at all. He requires minimal assist to get out of the chair, but ambulates with his walker down the hall.  He has start hesitation and the right foot sticks to the floor.  Once he is out in the hallway with his walker, he actually walks quite well and the stride length is better.  He has difficulty with turns and the right foot sticks to the floor when turning.     Chemistry      Component Value Date/Time   NA 142 05/02/2015 1034   NA 143 02/11/2015 1042   K 5.1 05/02/2015 1034   K 4.7 02/11/2015 1042   CL 107 02/11/2015 1042   CO2 24 05/02/2015 1034   CO2 19* 02/11/2015 1042   BUN 40.9* 05/02/2015 1034   BUN 41* 02/11/2015 1042   CREATININE 1.9* 05/02/2015 1034   CREATININE 1.79* 02/11/2015 1042   CREATININE 2.1* 06/23/2009 2309      Component Value Date/Time   CALCIUM 9.1 05/02/2015 1034   CALCIUM 8.4* 02/11/2015 1042   ALKPHOS 98 05/02/2015 1034   ALKPHOS 68  02/11/2015 1042   AST 10 05/02/2015 1034   AST 11 02/11/2015 1042   ALT <9 05/02/2015 1034   ALT 4* 02/11/2015 1042   BILITOT 0.44 05/02/2015 1034   BILITOT 0.6 02/11/2015 1042       ASSESSMENT/PLAN:  1.  Idiopathic Parkinson's disease, diagnosed since approximately 2011 and starting on levodopa in September, 2013.  -The patient's disease course has been complicated by hallucinations, cognitive impairment and postural instability.  -A UPDRS motor on/off test was done  11/12/14, which demonstrated that the patient does have levodopa resistant tremor, but that the levodopa was modestly helpful for rigidity and not all that helpful for gait and balance.  He is to continue 2 tablets tid.  Higher dosages increased hallucinations 2.  Hallucinations  -He did better after we decreased his levodopa from 2 tablets 4 times a day to 2 tablets 3 times per day.  However, his daughter-in-law called me  since that time and reported more hallucinations.  Both the patient and his wife state that they are not bothersome and he is not being aggressive (different than what daughter-in-law told me).  After a long discussion with the patient and his wife regarding risks, benefits, and side effects including the black box warning and the risk of prolongation of the QT interval, both of them decided to hold off on trying the drug.  They're willing to try it in the future if hallucinations increase.  If we do try it, he will need to be renally dosed at 17 mg, one tablet daily. 2.  Sialorrhea  -Overall mild and while we discussed Myobloc, they are not interested right now. 3.  Parkinson's dementia  -He was on Aricept but has discontinued it.  He does not like to take medications.  Fortunately, he does have caregivers and safety has been addressed in the home.  Has [employer] as he has long term care insurance. 4.  Renal insufficiency  - This is being monitored by his PCP and urology.   5.  Lung nodule  - He has  metastatic renal cell that has been fairly slow to progress and they are just watching this. 6.  Dyskinesia  -Did not see any today.  Tried amantadine in the past but increased hallucinations.  7.  I will plan on seeing him back in the next few months.  Much greater than 50% of this visit was spent in counseling with the patient and the family.  Total face to face time:  30 min

## 2015-05-28 ENCOUNTER — Emergency Department (HOSPITAL_COMMUNITY): Payer: Medicare Other

## 2015-05-28 ENCOUNTER — Encounter (HOSPITAL_COMMUNITY): Payer: Self-pay | Admitting: Nurse Practitioner

## 2015-05-28 ENCOUNTER — Inpatient Hospital Stay (HOSPITAL_COMMUNITY)
Admission: EM | Admit: 2015-05-28 | Discharge: 2015-06-02 | DRG: 871 | Disposition: A | Discharge status: Hospice | Payer: Medicare Other | Attending: Internal Medicine | Admitting: Internal Medicine

## 2015-05-28 DIAGNOSIS — I5032 Chronic diastolic (congestive) heart failure: Secondary | ICD-10-CM | POA: Diagnosis present

## 2015-05-28 DIAGNOSIS — G9340 Encephalopathy, unspecified: Secondary | ICD-10-CM | POA: Diagnosis not present

## 2015-05-28 DIAGNOSIS — A419 Sepsis, unspecified organism: Principal | ICD-10-CM | POA: Diagnosis present

## 2015-05-28 DIAGNOSIS — Z8249 Family history of ischemic heart disease and other diseases of the circulatory system: Secondary | ICD-10-CM

## 2015-05-28 DIAGNOSIS — G9341 Metabolic encephalopathy: Secondary | ICD-10-CM | POA: Diagnosis present

## 2015-05-28 DIAGNOSIS — G3183 Dementia with Lewy bodies: Secondary | ICD-10-CM | POA: Diagnosis present

## 2015-05-28 DIAGNOSIS — Z905 Acquired absence of kidney: Secondary | ICD-10-CM

## 2015-05-28 DIAGNOSIS — Z9842 Cataract extraction status, left eye: Secondary | ICD-10-CM

## 2015-05-28 DIAGNOSIS — Z79899 Other long term (current) drug therapy: Secondary | ICD-10-CM

## 2015-05-28 DIAGNOSIS — I429 Cardiomyopathy, unspecified: Secondary | ICD-10-CM | POA: Diagnosis present

## 2015-05-28 DIAGNOSIS — C799 Secondary malignant neoplasm of unspecified site: Secondary | ICD-10-CM

## 2015-05-28 DIAGNOSIS — Z96643 Presence of artificial hip joint, bilateral: Secondary | ICD-10-CM | POA: Diagnosis present

## 2015-05-28 DIAGNOSIS — C7801 Secondary malignant neoplasm of right lung: Secondary | ICD-10-CM | POA: Diagnosis present

## 2015-05-28 DIAGNOSIS — N390 Urinary tract infection, site not specified: Secondary | ICD-10-CM | POA: Diagnosis present

## 2015-05-28 DIAGNOSIS — Z87440 Personal history of urinary (tract) infections: Secondary | ICD-10-CM

## 2015-05-28 DIAGNOSIS — Z87891 Personal history of nicotine dependence: Secondary | ICD-10-CM

## 2015-05-28 DIAGNOSIS — N183 Chronic kidney disease, stage 3 unspecified: Secondary | ICD-10-CM | POA: Diagnosis present

## 2015-05-28 DIAGNOSIS — E871 Hypo-osmolality and hyponatremia: Secondary | ICD-10-CM | POA: Diagnosis present

## 2015-05-28 DIAGNOSIS — I130 Hypertensive heart and chronic kidney disease with heart failure and stage 1 through stage 4 chronic kidney disease, or unspecified chronic kidney disease: Secondary | ICD-10-CM | POA: Diagnosis present

## 2015-05-28 DIAGNOSIS — G20A1 Parkinson's disease without dyskinesia, without mention of fluctuations: Secondary | ICD-10-CM | POA: Diagnosis present

## 2015-05-28 DIAGNOSIS — R350 Frequency of micturition: Secondary | ICD-10-CM | POA: Diagnosis present

## 2015-05-28 DIAGNOSIS — A4151 Sepsis due to Escherichia coli [E. coli]: Secondary | ICD-10-CM | POA: Insufficient documentation

## 2015-05-28 DIAGNOSIS — Z833 Family history of diabetes mellitus: Secondary | ICD-10-CM

## 2015-05-28 DIAGNOSIS — F0280 Dementia in other diseases classified elsewhere without behavioral disturbance: Secondary | ICD-10-CM | POA: Diagnosis present

## 2015-05-28 DIAGNOSIS — R410 Disorientation, unspecified: Secondary | ICD-10-CM | POA: Diagnosis not present

## 2015-05-28 DIAGNOSIS — R0602 Shortness of breath: Secondary | ICD-10-CM

## 2015-05-28 DIAGNOSIS — Z66 Do not resuscitate: Secondary | ICD-10-CM | POA: Diagnosis present

## 2015-05-28 DIAGNOSIS — C641 Malignant neoplasm of right kidney, except renal pelvis: Secondary | ICD-10-CM | POA: Diagnosis present

## 2015-05-28 DIAGNOSIS — I350 Nonrheumatic aortic (valve) stenosis: Secondary | ICD-10-CM | POA: Diagnosis present

## 2015-05-28 DIAGNOSIS — Z85528 Personal history of other malignant neoplasm of kidney: Secondary | ICD-10-CM

## 2015-05-28 DIAGNOSIS — G20 Parkinson's disease: Secondary | ICD-10-CM | POA: Diagnosis present

## 2015-05-28 DIAGNOSIS — N179 Acute kidney failure, unspecified: Secondary | ICD-10-CM | POA: Diagnosis present

## 2015-05-28 DIAGNOSIS — M1990 Unspecified osteoarthritis, unspecified site: Secondary | ICD-10-CM | POA: Diagnosis present

## 2015-05-28 DIAGNOSIS — Z515 Encounter for palliative care: Secondary | ICD-10-CM | POA: Diagnosis present

## 2015-05-28 DIAGNOSIS — Z882 Allergy status to sulfonamides status: Secondary | ICD-10-CM

## 2015-05-28 DIAGNOSIS — E860 Dehydration: Secondary | ICD-10-CM | POA: Diagnosis present

## 2015-05-28 DIAGNOSIS — Z7189 Other specified counseling: Secondary | ICD-10-CM | POA: Diagnosis present

## 2015-05-28 DIAGNOSIS — Z823 Family history of stroke: Secondary | ICD-10-CM

## 2015-05-28 DIAGNOSIS — Z9841 Cataract extraction status, right eye: Secondary | ICD-10-CM

## 2015-05-28 DIAGNOSIS — I5021 Acute systolic (congestive) heart failure: Secondary | ICD-10-CM | POA: Clinically undetermined

## 2015-05-28 DIAGNOSIS — C7802 Secondary malignant neoplasm of left lung: Secondary | ICD-10-CM | POA: Diagnosis present

## 2015-05-28 DIAGNOSIS — R509 Fever, unspecified: Secondary | ICD-10-CM | POA: Diagnosis present

## 2015-05-28 DIAGNOSIS — Z818 Family history of other mental and behavioral disorders: Secondary | ICD-10-CM

## 2015-05-28 DIAGNOSIS — K219 Gastro-esophageal reflux disease without esophagitis: Secondary | ICD-10-CM | POA: Diagnosis present

## 2015-05-28 DIAGNOSIS — N400 Enlarged prostate without lower urinary tract symptoms: Secondary | ICD-10-CM | POA: Diagnosis present

## 2015-05-28 LAB — CBC WITH DIFFERENTIAL/PLATELET
BASOS ABS: 0 10*3/uL (ref 0.0–0.1)
Basophils Relative: 0 %
EOS PCT: 0 %
Eosinophils Absolute: 0 10*3/uL (ref 0.0–0.7)
HEMATOCRIT: 34.3 % — AB (ref 39.0–52.0)
HEMOGLOBIN: 11.5 g/dL — AB (ref 13.0–17.0)
LYMPHS PCT: 8 %
Lymphs Abs: 1.6 10*3/uL (ref 0.7–4.0)
MCH: 32 pg (ref 26.0–34.0)
MCHC: 33.5 g/dL (ref 30.0–36.0)
MCV: 95.5 fL (ref 78.0–100.0)
MONOS PCT: 6 %
Monocytes Absolute: 1.2 10*3/uL — ABNORMAL HIGH (ref 0.1–1.0)
NEUTROS PCT: 86 %
Neutro Abs: 16.9 10*3/uL — ABNORMAL HIGH (ref 1.7–7.7)
PLATELETS: 175 10*3/uL (ref 150–400)
RBC: 3.59 MIL/uL — AB (ref 4.22–5.81)
RDW: 14.1 % (ref 11.5–15.5)
WBC: 19.7 10*3/uL — AB (ref 4.0–10.5)

## 2015-05-28 LAB — URINE MICROSCOPIC-ADD ON: SQUAMOUS EPITHELIAL / LPF: NONE SEEN

## 2015-05-28 LAB — URINALYSIS, ROUTINE W REFLEX MICROSCOPIC
BILIRUBIN URINE: NEGATIVE
Glucose, UA: NEGATIVE mg/dL
KETONES UR: NEGATIVE mg/dL
NITRITE: POSITIVE — AB
PROTEIN: 100 mg/dL — AB
SPECIFIC GRAVITY, URINE: 1.027 (ref 1.005–1.030)
pH: 6.5 (ref 5.0–8.0)

## 2015-05-28 LAB — COMPREHENSIVE METABOLIC PANEL
ALT: <5 U/L — ABNORMAL LOW (ref 17–63)
ANION GAP: 7 (ref 5–15)
AST: 17 U/L (ref 15–41)
Albumin: 3.9 g/dL (ref 3.5–5.0)
Alkaline Phosphatase: 82 U/L (ref 38–126)
BUN: 43 mg/dL — AB (ref 6–20)
CHLORIDE: 106 mmol/L (ref 101–111)
CO2: 23 mmol/L (ref 22–32)
Calcium: 8.4 mg/dL — ABNORMAL LOW (ref 8.9–10.3)
Creatinine, Ser: 2.13 mg/dL — ABNORMAL HIGH (ref 0.61–1.24)
GFR, EST AFRICAN AMERICAN: 31 mL/min — AB (ref >60)
GFR, EST NON AFRICAN AMERICAN: 27 mL/min — AB (ref >60)
Glucose, Bld: 131 mg/dL — ABNORMAL HIGH (ref 65–99)
POTASSIUM: 5 mmol/L (ref 3.5–5.1)
Sodium: 136 mmol/L (ref 135–145)
Total Bilirubin: 0.9 mg/dL (ref 0.3–1.2)
Total Protein: 6.4 g/dL — ABNORMAL LOW (ref 6.5–8.1)

## 2015-05-28 LAB — I-STAT CG4 LACTIC ACID, ED: LACTIC ACID, VENOUS: 1.57 mmol/L (ref 0.5–2.0)

## 2015-05-28 MED ORDER — DEXTROSE 5 % IV SOLN: 2.0000 g | Freq: Once | INTRAVENOUS | Status: DC

## 2015-05-28 MED ORDER — DOCUSATE SODIUM 50 MG PO CAPS
50.0000 mg | ORAL_CAPSULE | Freq: Two times a day (BID) | ORAL | Status: DC
  Administered 2015-05-28 – 2015-06-02 (×9): 50 mg via ORAL
  Filled 2015-05-28 (×13): qty 1

## 2015-05-28 MED ORDER — ACETAMINOPHEN 325 MG PO TABS
650.0000 mg | ORAL_TABLET | Freq: Four times a day (QID) | ORAL | Status: DC | PRN
  Administered 2015-05-28 – 2015-06-01 (×8): 650 mg via ORAL
  Filled 2015-05-28 (×8): qty 2

## 2015-05-28 MED ORDER — ACETAMINOPHEN 650 MG RE SUPP: 650.0000 mg | Freq: Four times a day (QID) | RECTAL | Status: DC | PRN

## 2015-05-28 MED ORDER — POLYETHYLENE GLYCOL 3350 17 G PO PACK
17.0000 g | PACK | Freq: Every day | ORAL | Status: DC | PRN
  Administered 2015-05-30: 17 g via ORAL
  Filled 2015-05-28 (×2): qty 1

## 2015-05-28 MED ORDER — ENOXAPARIN SODIUM 30 MG/0.3ML ~~LOC~~ SOLN
30.0000 mg | SUBCUTANEOUS | Status: DC
  Administered 2015-05-28 – 2015-05-31 (×4): 30 mg via SUBCUTANEOUS
  Filled 2015-05-28 (×4): qty 0.3

## 2015-05-28 MED ORDER — CEFTRIAXONE SODIUM 1 G IJ SOLR
1.0000 g | INTRAMUSCULAR | Status: DC
  Administered 2015-05-28 – 2015-05-29 (×2): 1 g via INTRAVENOUS
  Filled 2015-05-28 (×2): qty 10

## 2015-05-28 MED ORDER — SODIUM CHLORIDE 0.9 % IV SOLN
INTRAVENOUS | Status: AC
  Administered 2015-05-28: 20:00:00 via INTRAVENOUS

## 2015-05-28 MED ORDER — HYDROCODONE-ACETAMINOPHEN 5-325 MG PO TABS: 1.0000 | ORAL_TABLET | ORAL | Status: DC | PRN

## 2015-05-28 MED ORDER — ONDANSETRON HCL 4 MG PO TABS: 4.0000 mg | ORAL_TABLET | Freq: Four times a day (QID) | ORAL | Status: DC | PRN

## 2015-05-28 MED ORDER — ONDANSETRON HCL 4 MG/2ML IJ SOLN: 4.0000 mg | Freq: Four times a day (QID) | INTRAMUSCULAR | Status: DC | PRN

## 2015-05-28 MED ORDER — CARBIDOPA-LEVODOPA 25-100 MG PO TABS
2.0000 | ORAL_TABLET | Freq: Three times a day (TID) | ORAL | Status: DC
  Administered 2015-05-29 – 2015-06-02 (×14): 2 via ORAL
  Filled 2015-05-28 (×19): qty 2

## 2015-05-28 MED ORDER — DIPHENHYDRAMINE HCL 25 MG PO CAPS
ORAL_CAPSULE | ORAL | Status: AC
  Filled 2015-05-28: qty 1

## 2015-05-28 MED ORDER — DIPHENHYDRAMINE HCL 25 MG PO CAPS
25.0000 mg | ORAL_CAPSULE | Freq: Once | ORAL | Status: AC
  Administered 2015-05-28: 25 mg via ORAL

## 2015-05-28 NOTE — ED Notes (Signed)
Highest temp son (primary caregiver) has seen was 101.4. Has been treating at home with 1000mg  of Tylenol. Last dose was 1300.

## 2015-05-28 NOTE — ED Provider Notes (Signed)
CSN: [id number]     Arrival date & time 05/28/15  1443 History   First MD Initiated Contact with Patient 05/28/15 1517     Chief Complaint  Patient presents with  . Altered Mental Status  . Urinary Frequency     (Consider location/radiation/quality/duration/timing/severity/associated sxs/prior Treatment) HPI Comments: Patient here with urinary frequency 24 hours. Also with increased confusion and lethargy. Patient has a history of Parkinson's and according to the son who is the patient's caregiver he has had more trouble ambulating. Does use a walker normally. No reported history of vomiting or diarrhea. No cough or congestion. No headache or neck pain. No photophobia noted. Patient does have early dementia but can carry a conversation normally. Fever at home treated with Tylenol. Does have a prior history of UTI.  Patient is a 81 y.o. male presenting with altered mental status and frequency. The history is provided by the patient and a relative.  Altered Mental Status Urinary Frequency    Past Medical History  Diagnosis Date  . Hypertension   . GERD (gastroesophageal reflux disease)   . Parkinsonism ([place])   . Renal cell cancer ([place]) 2010  . Degenerative disc disease, cervical   . Degenerative disc disease, lumbar   . Osteoarthritis   . Depression   . H/O rotator cuff tear   . BPH (benign prostatic hyperplasia)   . Renal insufficiency   . Lung nodules    Past Surgical History  Procedure Laterality Date  . Nephrectomy Right   . Total hip arthroplasty Bilateral   . Cervical laminectomy    . Cataract extraction, bilateral  2015   Family History  Problem Relation Age of Onset  . CAD    . Hypertension    . CVA    . Diabetes    . Kidney cancer    . Depression     Social History  Substance Use Topics  . Smoking status: Former Research scientist (life sciences)  . Smokeless tobacco: Never Used     Comment: quit 1970's  . Alcohol Use: No     Comment: 1 time every 4 weeks    Review of Systems   Genitourinary: Positive for frequency.  All other systems reviewed and are negative.     Allergies  Sulfa antibiotics  Home Medications   Prior to Admission medications   Medication Sig Start Date End Date Taking? Authorizing Provider  carbidopa-levodopa (SINEMET IR) 25-100 MG tablet Take 2 tablets by mouth 3 (three) times daily. 05/10/15  Yes Chauncey Cruel, MD  Cholecalciferol 2000 UNITS CAPS Take 2,000 Units by mouth daily.   Yes Historical Provider, MD  docusate sodium (COLACE) 50 MG capsule Take 50 mg by mouth 2 (two) times daily.   Yes Historical Provider, MD  Misc Natural Products (FIBER 7) POWD Take 1 packet by mouth daily.    Yes Historical Provider, MD  Multiple Vitamin (MULTIVITAMIN) tablet Take 1 tablet by mouth daily.   Yes Historical Provider, MD  polyethylene glycol (MIRALAX / GLYCOLAX) packet Take 17 g by mouth daily as needed for moderate constipation.    Yes Historical Provider, MD   BP 150/70 mmHg  Pulse 84  Temp(Src) 101.3 F (38.5 C) (Oral)  Ht 5\' 6"  (1.676 m)  Wt 63.504 kg  BMI 22.61 kg/m2  SpO2 97% Physical Exam  Constitutional: He is oriented to person, place, and time. He appears well-developed and well-nourished.  Non-toxic appearance. No distress.  HENT:  Head: Normocephalic and atraumatic.  Eyes: Conjunctivae, EOM and lids  are normal. Pupils are equal, round, and reactive to light.  Neck: Normal range of motion. Neck supple. No tracheal deviation present. No thyroid mass present.  Cardiovascular: Normal rate, regular rhythm and normal heart sounds.  Exam reveals no gallop.   No murmur heard. Pulmonary/Chest: Effort normal and breath sounds normal. No stridor. No respiratory distress. He has no decreased breath sounds. He has no wheezes. He has no rhonchi. He has no rales.  Abdominal: Soft. Normal appearance and bowel sounds are normal. He exhibits no distension. There is no tenderness. There is no rebound and no CVA tenderness.  Musculoskeletal:  Normal range of motion. He exhibits no edema or tenderness.  Neurological: He is alert and oriented to person, place, and time. He has normal strength. He displays tremor. No cranial nerve deficit or sensory deficit. GCS eye subscore is 4. GCS verbal subscore is 5. GCS motor subscore is 6.  Skin: Skin is warm and dry. No abrasion and no rash noted.  Psychiatric: His affect is blunt. His speech is delayed. He is slowed.  Nursing note and vitals reviewed.   ED Course  Procedures (including critical care time) Labs Review Labs Reviewed  CULTURE, BLOOD (ROUTINE X 2)  CULTURE, BLOOD (ROUTINE X 2)  URINE CULTURE  COMPREHENSIVE METABOLIC PANEL  CBC WITH DIFFERENTIAL/PLATELET  URINALYSIS, ROUTINE W REFLEX MICROSCOPIC (NOT AT [facility])  I-STAT CG4 LACTIC ACID, ED    Imaging Review Dg Chest 2 View  05/28/2015  CLINICAL DATA:  Shortness of breath today. EXAM: CHEST  2 VIEW COMPARISON:  05/02/2015 and 11/11/2014 FINDINGS: Lungs are adequately inflated without focal consolidation or effusion. Mild stable cardiomegaly. Calcified plaque over the thoracoabdominal aorta. Mild anterior wedging of a mid thoracic vertebral body and vertebral body near the thoracolumbar junction unchanged. IMPRESSION: No active cardiopulmonary disease. Mild cardiomegaly. Stable anterior wedge compression deformities of the spine. Electronically Signed   By: Marin Olp M.D.   On: 05/28/2015 15:34   I have personally reviewed and evaluated these images and lab results as part of my medical decision-making.   EKG Interpretation None      MDM   Final diagnoses:  SOB (shortness of breath)    Blood cultures obtained and patient's urinalysis shows infection. Was started on Rocephin for the UTI. Mental status is stable and patient will be admitted to the medicine service    Lacretia Leigh, MD 05/28/15 [phone]

## 2015-05-28 NOTE — ED Notes (Signed)
Son observed father to have increased urinary frequency late last night. This morning patient was observed to be increasingly confused with increased difficulty ambulating. Temp at 1pm today was 101.4, patient was given 1000mg  of Tylenol PO and brought to ED.

## 2015-05-28 NOTE — H&P (Signed)
PCP: Mathews Argyle, MD  [facility]  Referring provider Alen   Chief Complaint:  confusion  HPI: Richard Mcgee is a 81 y.o. male   has a past medical history of Hypertension; GERD (gastroesophageal reflux disease); Parkinsonism ([place]); Renal cell cancer ([place]) (2010); Degenerative disc disease, cervical; Degenerative disc disease, lumbar; Osteoarthritis; Depression; H/O rotator cuff tear; BPH (benign prostatic hyperplasia); Renal insufficiency; and Lung nodules.   Presented with  Fever up to 101.4 increasing urinary frequency and confusion for 24 hours. Patient has trouble ambulating at baseline but has gotten worse today. Patient's family brought him to emergency department in ER patient was noted to have creatinine 2.13 which is around his baseline white blood cell count elevated at 19.7 lactic acid 1.57 UA with too many red blood cells and white blood cells positive nitrites and many bacteria consistent with UTI. Patient meeting sepsis criteria. Fever up to 101.3 respiratory rate 28 his white blood cell, 19.7. In emergency department patient was given Rocephin 1 g  Patient has known history of renal cell carcinoma since 2010 status post right nephrectomy as well as bilateral lung masses that is slowly growing and he is currently choosing to have only observation.  Issue known history of Parkinson disease followed by neurology. In October patient had a fall for which she has been seen in emergency department due to laceration. Hospitalist was called for admission for sepsis with acute encephalopathy and UTI  Review of Systems:    Pertinent positives include: Fevers, chills, fatigue,  Constitutional:  No weight loss, night sweats,  weight loss  HEENT:  No headaches, Difficulty swallowing,Tooth/dental problems,Sore throat,  No sneezing, itching, ear ache, nasal congestion, post nasal drip,  Cardio-vascular:  No chest pain, Orthopnea, PND,  anasarca, dizziness, palpitations.no Bilateral lower extremity swelling  GI:  No heartburn, indigestion, abdominal pain, nausea, vomiting, diarrhea, change in bowel habits, loss of appetite, melena, blood in stool, hematemesis Resp:  no shortness of breath at rest. No dyspnea on exertion, No excess mucus, no productive cough, No non-productive cough, No coughing up of blood.No change in color of mucus.No wheezing. Skin:  no rash or lesions. No jaundice GU:  no dysuria, change in color of urine, no urgency or frequency. No straining to urinate.  No flank pain.  Musculoskeletal:  No joint pain or no joint swelling. No decreased range of motion. No back pain.  Psych:  No change in mood or affect. No depression or anxiety. No memory loss.  Neuro: no localizing neurological complaints, no tingling, no weakness, no double vision, no gait abnormality, no slurred speech, no confusion  Otherwise ROS are negative except for above, 10 systems were reviewed  Past Medical History: Past Medical History  Diagnosis Date  . Hypertension   . GERD (gastroesophageal reflux disease)   . Parkinsonism ([place])   . Renal cell cancer ([place]) 2010  . Degenerative disc disease, cervical   . Degenerative disc disease, lumbar   . Osteoarthritis   . Depression   . H/O rotator cuff tear   . BPH (benign prostatic hyperplasia)   . Renal insufficiency   . Lung nodules    Past Surgical History  Procedure Laterality Date  . Nephrectomy Right   . Total hip arthroplasty Bilateral   . Cervical laminectomy    . Cataract extraction, bilateral  2015     Medications: Prior to Admission medications   Medication Sig Start Date End Date Taking? Authorizing Provider  carbidopa-levodopa (SINEMET IR) 25-100 MG  tablet Take 2 tablets by mouth 3 (three) times daily. 05/10/15  Yes Chauncey Cruel, MD  Cholecalciferol 2000 UNITS CAPS Take 2,000 Units by mouth daily.   Yes Historical Provider, MD  docusate sodium (COLACE) 50  MG capsule Take 50 mg by mouth 2 (two) times daily.   Yes Historical Provider, MD  Misc Natural Products (FIBER 7) POWD Take 1 packet by mouth daily.    Yes Historical Provider, MD  Multiple Vitamin (MULTIVITAMIN) tablet Take 1 tablet by mouth daily.   Yes Historical Provider, MD  polyethylene glycol (MIRALAX / GLYCOLAX) packet Take 17 g by mouth daily as needed for moderate constipation.    Yes Historical Provider, MD    Allergies:   Allergies  Allergen Reactions  . Sulfa Antibiotics     Patient's son states that he is not aware that patient has this allergy. No known allergic events.    Social History:  Ambulatory  walker   Lives at home  With family has caregivers at home      reports that he has quit smoking. He has never used smokeless tobacco. He reports that he does not drink alcohol or use illicit drugs.    Family History: family history includes CAD in an other family member; CVA in an other family member; Depression in an other family member; Diabetes in an other family member; Hypertension in an other family member; Kidney cancer in an other family member.    Physical Exam: Patient Vitals for the past 24 hrs:  BP Temp Temp src Pulse Resp SpO2 Height Weight  05/28/15 1830 152/68 mmHg - - 73 23 97 % - -  05/28/15 1800 152/75 mmHg - - 73 (!) 28 95 % - -  05/28/15 1730 154/72 mmHg - - 74 22 97 % - -  05/28/15 1700 154/76 mmHg - - 80 19 96 % - -  05/28/15 1630 144/67 mmHg - - 78 20 96 % - -  05/28/15 1615 - - - 78 22 97 % - -  05/28/15 1600 (!) 142/117 mmHg - - 79 20 96 % - -  05/28/15 1545 - - - 81 20 95 % - -  05/28/15 1530 153/69 mmHg - - 85 18 98 % - -  05/28/15 1509 150/70 mmHg 101.3 F (38.5 C) Oral 84 - 97 % 5\' 6"  (1.676 m) 63.504 kg (140 lb)  05/28/15 1456 147/65 mmHg 99.6 F (37.6 C) Oral 87 - 95 % - -    1. General:  in No Acute distress 2. Psychological: Alert and  Oriented to self but not situation 3. Head/ENT:    Dry Mucous Membranes                           Head Non traumatic, neck supple                          Normal    Dentition 4. SKIN:   decreased Skin turgor,  Skin clean Dry and intact no rash 5. Heart: Regular rate and rhythm loud systolic Murmur, Rub or gallop 6. Lungs:  no wheezes some mild crackles  at the bases 7. Abdomen: Soft, non-tender, Non distended 8. Lower extremities: no clubbing, cyanosis, or edema 9. Neurologically Grossly intact, moving all 4 extremities equally 10. MSK: Normal range of motion  body mass index is 22.61 kg/(m^2).   Labs on Admission:   Results for  orders placed or performed during the hospital encounter of 05/28/15 (from the past 24 hour(s))  Comprehensive metabolic panel     Status: Abnormal   Collection Time: 05/28/15  3:47 PM  Result Value Ref Range   Sodium 136 135 - 145 mmol/L   Potassium 5.0 3.5 - 5.1 mmol/L   Chloride 106 101 - 111 mmol/L   CO2 23 22 - 32 mmol/L   Glucose, Bld 131 (H) 65 - 99 mg/dL   BUN 43 (H) 6 - 20 mg/dL   Creatinine, Ser 2.13 (H) 0.61 - 1.24 mg/dL   Calcium 8.4 (L) 8.9 - 10.3 mg/dL   Total Protein 6.4 (L) 6.5 - 8.1 g/dL   Albumin 3.9 3.5 - 5.0 g/dL   AST 17 15 - 41 U/L   ALT <5 (L) 17 - 63 U/L   Alkaline Phosphatase 82 38 - 126 U/L   Total Bilirubin 0.9 0.3 - 1.2 mg/dL   GFR calc non Af Amer 27 (L) >60 mL/min   GFR calc Af Amer 31 (L) >60 mL/min   Anion gap 7 5 - 15  CBC with Differential     Status: Abnormal   Collection Time: 05/28/15  3:47 PM  Result Value Ref Range   WBC 19.7 (H) 4.0 - 10.5 K/uL   RBC 3.59 (L) 4.22 - 5.81 MIL/uL   Hemoglobin 11.5 (L) 13.0 - 17.0 g/dL   HCT 34.3 (L) 39.0 - 52.0 %   MCV 95.5 78.0 - 100.0 fL   MCH 32.0 26.0 - 34.0 pg   MCHC 33.5 30.0 - 36.0 g/dL   RDW 14.1 11.5 - 15.5 %   Platelets 175 150 - 400 K/uL   Neutrophils Relative % 86 %   Lymphocytes Relative 8 %   Monocytes Relative 6 %   Eosinophils Relative 0 %   Basophils Relative 0 %   Neutro Abs 16.9 (H) 1.7 - 7.7 K/uL   Lymphs Abs 1.6 0.7 - 4.0 K/uL    Monocytes Absolute 1.2 (H) 0.1 - 1.0 K/uL   Eosinophils Absolute 0.0 0.0 - 0.7 K/uL   Basophils Absolute 0.0 0.0 - 0.1 K/uL   Smear Review MORPHOLOGY UNREMARKABLE   Urinalysis, Routine w reflex microscopic (not at [facility])     Status: Abnormal   Collection Time: 05/28/15  3:47 PM  Result Value Ref Range   Color, Urine AMBER (A) YELLOW   APPearance TURBID (A) CLEAR   Specific Gravity, Urine 1.027 1.005 - 1.030   pH 6.5 5.0 - 8.0   Glucose, UA NEGATIVE NEGATIVE mg/dL   Hgb urine dipstick LARGE (A) NEGATIVE   Bilirubin Urine NEGATIVE NEGATIVE   Ketones, ur NEGATIVE NEGATIVE mg/dL   Protein, ur 100 (A) NEGATIVE mg/dL   Nitrite POSITIVE (A) NEGATIVE   Leukocytes, UA MODERATE (A) NEGATIVE  Urine microscopic-add on     Status: Abnormal   Collection Time: 05/28/15  3:47 PM  Result Value Ref Range   Squamous Epithelial / LPF NONE SEEN NONE SEEN   WBC, UA TOO NUMEROUS TO COUNT 0 - 5 WBC/hpf   RBC / HPF TOO NUMEROUS TO COUNT 0 - 5 RBC/hpf   Bacteria, UA MANY (A) NONE SEEN  I-Stat CG4 Lactic Acid, ED     Status: None   Collection Time: 05/28/15  4:18 PM  Result Value Ref Range   Lactic Acid, Venous 1.57 0.5 - 2.0 mmol/L    RBC and WBC too numerous to count  too many bacteria    No  results found for: HGBA1C  Estimated Creatinine Clearance: 22.8 mL/min (by C-G formula based on Cr of 2.13).  BNP (last 3 results) No results for input(s): PROBNP in the last 8760 hours.  Other results:  I have pearsonaly reviewed this: ECG REPORT  not obtained    Filed Weights   05/28/15 1509  Weight: 63.504 kg (140 lb)     Cultures: No results found for: SDES, SPECREQUEST, CULT, REPTSTATUS   Radiological Exams on Admission: Dg Chest 2 View  05/28/2015  CLINICAL DATA:  Shortness of breath today. EXAM: CHEST  2 VIEW COMPARISON:  05/02/2015 and 11/11/2014 FINDINGS: Lungs are adequately inflated without focal consolidation or effusion. Mild stable cardiomegaly. Calcified plaque over the  thoracoabdominal aorta. Mild anterior wedging of a mid thoracic vertebral body and vertebral body near the thoracolumbar junction unchanged. IMPRESSION: No active cardiopulmonary disease. Mild cardiomegaly. Stable anterior wedge compression deformities of the spine. Electronically Signed   By: Marin Olp M.D.   On: 05/28/2015 15:34    Chart has been reviewed  Family   at  Bedside  plan of care was discussed with  Son [phone]  Assessment/Plan 81 yo  male with history of Parkinson disease and Parkinson dementia with chronic kidney disease and diastolic heart failure presents with sepsis and acute encephalopathy in the setting of UTI Present on Admission:  . Sepsis ([place]) hemodynamically stable blood cultures pending urine culture pending. We'll admit for IV antibiotics fluid rehydration and observation. Lactic acid within normal limits  . UTI (lower urinary tract infection) transfer Rocephin await results of urine culture  . Renal cell carcinoma of right kidney metastatic to other site [facility]) stable monitor creatinine  . Parkinson's disease (tremor, stiffness, slow motion, unstable posture) ([place]) continue home medications PT OT evaluation prior to discharge for safety  . CKD (chronic kidney disease), stage III chronic creatinine currently at baseline continue to monitor  . Acute encephalopathy most likely secondary to sepsis and UTI. We'll rehydrate gently treat urinary tract infection monitor mental status  . Chronic diastolic CHF (congestive heart failure), NYHA class 1 (HCC) chronic and well compensated     Prophylaxis: Lovenox   CODE STATUS:    DNR/DNI as per family  Disposition: To home once workup is complete and patient is stable  Other plan as per orders.  I have spent a total of 55 min on this admission  Richard Mcgee 05/28/2015, 6:38 PM  [facility]  Pager [phone]   after 2 AM please page floor coverage PA If 7AM-7PM, please contact the day team taking  care of the patient  [email or web address]  Password [email or web address]

## 2015-05-29 DIAGNOSIS — A4151 Sepsis due to Escherichia coli [E. coli]: Secondary | ICD-10-CM | POA: Diagnosis not present

## 2015-05-29 DIAGNOSIS — Z79899 Other long term (current) drug therapy: Secondary | ICD-10-CM | POA: Diagnosis not present

## 2015-05-29 DIAGNOSIS — Z87440 Personal history of urinary (tract) infections: Secondary | ICD-10-CM | POA: Diagnosis not present

## 2015-05-29 DIAGNOSIS — I350 Nonrheumatic aortic (valve) stenosis: Secondary | ICD-10-CM | POA: Diagnosis present

## 2015-05-29 DIAGNOSIS — Z66 Do not resuscitate: Secondary | ICD-10-CM | POA: Diagnosis present

## 2015-05-29 DIAGNOSIS — N183 Chronic kidney disease, stage 3 (moderate): Secondary | ICD-10-CM | POA: Diagnosis present

## 2015-05-29 DIAGNOSIS — Z789 Other specified health status: Secondary | ICD-10-CM | POA: Diagnosis not present

## 2015-05-29 DIAGNOSIS — M1990 Unspecified osteoarthritis, unspecified site: Secondary | ICD-10-CM | POA: Diagnosis present

## 2015-05-29 DIAGNOSIS — B9620 Unspecified Escherichia coli [E. coli] as the cause of diseases classified elsewhere: Secondary | ICD-10-CM | POA: Diagnosis not present

## 2015-05-29 DIAGNOSIS — N400 Enlarged prostate without lower urinary tract symptoms: Secondary | ICD-10-CM | POA: Diagnosis present

## 2015-05-29 DIAGNOSIS — Z9841 Cataract extraction status, right eye: Secondary | ICD-10-CM | POA: Diagnosis not present

## 2015-05-29 DIAGNOSIS — K219 Gastro-esophageal reflux disease without esophagitis: Secondary | ICD-10-CM | POA: Diagnosis present

## 2015-05-29 DIAGNOSIS — F0280 Dementia in other diseases classified elsewhere without behavioral disturbance: Secondary | ICD-10-CM | POA: Diagnosis present

## 2015-05-29 DIAGNOSIS — Z9842 Cataract extraction status, left eye: Secondary | ICD-10-CM | POA: Diagnosis not present

## 2015-05-29 DIAGNOSIS — G20 Parkinson's disease: Secondary | ICD-10-CM | POA: Diagnosis not present

## 2015-05-29 DIAGNOSIS — C7801 Secondary malignant neoplasm of right lung: Secondary | ICD-10-CM | POA: Diagnosis present

## 2015-05-29 DIAGNOSIS — Z823 Family history of stroke: Secondary | ICD-10-CM | POA: Diagnosis not present

## 2015-05-29 DIAGNOSIS — A419 Sepsis, unspecified organism: Secondary | ICD-10-CM | POA: Diagnosis present

## 2015-05-29 DIAGNOSIS — I429 Cardiomyopathy, unspecified: Secondary | ICD-10-CM | POA: Diagnosis present

## 2015-05-29 DIAGNOSIS — Z833 Family history of diabetes mellitus: Secondary | ICD-10-CM | POA: Diagnosis not present

## 2015-05-29 DIAGNOSIS — G9341 Metabolic encephalopathy: Secondary | ICD-10-CM | POA: Diagnosis present

## 2015-05-29 DIAGNOSIS — Z96643 Presence of artificial hip joint, bilateral: Secondary | ICD-10-CM | POA: Diagnosis present

## 2015-05-29 DIAGNOSIS — I5021 Acute systolic (congestive) heart failure: Secondary | ICD-10-CM | POA: Diagnosis not present

## 2015-05-29 DIAGNOSIS — Z8249 Family history of ischemic heart disease and other diseases of the circulatory system: Secondary | ICD-10-CM | POA: Diagnosis not present

## 2015-05-29 DIAGNOSIS — Z7189 Other specified counseling: Secondary | ICD-10-CM | POA: Diagnosis not present

## 2015-05-29 DIAGNOSIS — I5032 Chronic diastolic (congestive) heart failure: Secondary | ICD-10-CM | POA: Diagnosis present

## 2015-05-29 DIAGNOSIS — R350 Frequency of micturition: Secondary | ICD-10-CM | POA: Diagnosis present

## 2015-05-29 DIAGNOSIS — Z905 Acquired absence of kidney: Secondary | ICD-10-CM | POA: Diagnosis not present

## 2015-05-29 DIAGNOSIS — Z87891 Personal history of nicotine dependence: Secondary | ICD-10-CM | POA: Diagnosis not present

## 2015-05-29 DIAGNOSIS — E860 Dehydration: Secondary | ICD-10-CM | POA: Diagnosis present

## 2015-05-29 DIAGNOSIS — C7802 Secondary malignant neoplasm of left lung: Secondary | ICD-10-CM | POA: Diagnosis present

## 2015-05-29 DIAGNOSIS — Z882 Allergy status to sulfonamides status: Secondary | ICD-10-CM | POA: Diagnosis not present

## 2015-05-29 DIAGNOSIS — C641 Malignant neoplasm of right kidney, except renal pelvis: Secondary | ICD-10-CM | POA: Diagnosis present

## 2015-05-29 DIAGNOSIS — N390 Urinary tract infection, site not specified: Secondary | ICD-10-CM | POA: Diagnosis present

## 2015-05-29 DIAGNOSIS — Z85528 Personal history of other malignant neoplasm of kidney: Secondary | ICD-10-CM | POA: Diagnosis not present

## 2015-05-29 DIAGNOSIS — R509 Fever, unspecified: Secondary | ICD-10-CM | POA: Diagnosis not present

## 2015-05-29 DIAGNOSIS — E871 Hypo-osmolality and hyponatremia: Secondary | ICD-10-CM | POA: Diagnosis present

## 2015-05-29 DIAGNOSIS — N179 Acute kidney failure, unspecified: Secondary | ICD-10-CM | POA: Diagnosis present

## 2015-05-29 DIAGNOSIS — Z818 Family history of other mental and behavioral disorders: Secondary | ICD-10-CM | POA: Diagnosis not present

## 2015-05-29 DIAGNOSIS — G9340 Encephalopathy, unspecified: Secondary | ICD-10-CM | POA: Diagnosis not present

## 2015-05-29 DIAGNOSIS — R410 Disorientation, unspecified: Secondary | ICD-10-CM | POA: Diagnosis present

## 2015-05-29 DIAGNOSIS — I130 Hypertensive heart and chronic kidney disease with heart failure and stage 1 through stage 4 chronic kidney disease, or unspecified chronic kidney disease: Secondary | ICD-10-CM | POA: Diagnosis present

## 2015-05-29 DIAGNOSIS — Z515 Encounter for palliative care: Secondary | ICD-10-CM | POA: Diagnosis present

## 2015-05-29 DIAGNOSIS — G3183 Dementia with Lewy bodies: Secondary | ICD-10-CM | POA: Diagnosis present

## 2015-05-29 LAB — CBC
HCT: 32.8 % — ABNORMAL LOW (ref 39.0–52.0)
Hemoglobin: 10.8 g/dL — ABNORMAL LOW (ref 13.0–17.0)
MCH: 31.3 pg (ref 26.0–34.0)
MCHC: 32.9 g/dL (ref 30.0–36.0)
MCV: 95.1 fL (ref 78.0–100.0)
PLATELETS: 149 10*3/uL — AB (ref 150–400)
RBC: 3.45 MIL/uL — AB (ref 4.22–5.81)
RDW: 14 % (ref 11.5–15.5)
WBC: 20 10*3/uL — ABNORMAL HIGH (ref 4.0–10.5)

## 2015-05-29 LAB — MAGNESIUM: MAGNESIUM: 1.8 mg/dL (ref 1.7–2.4)

## 2015-05-29 LAB — COMPREHENSIVE METABOLIC PANEL
ALK PHOS: 61 U/L (ref 38–126)
AST: 13 U/L — AB (ref 15–41)
Albumin: 3.5 g/dL (ref 3.5–5.0)
Anion gap: 8 (ref 5–15)
BUN: 43 mg/dL — AB (ref 6–20)
CALCIUM: 8.1 mg/dL — AB (ref 8.9–10.3)
CHLORIDE: 101 mmol/L (ref 101–111)
CO2: 21 mmol/L — AB (ref 22–32)
CREATININE: 2.12 mg/dL — AB (ref 0.61–1.24)
GFR calc non Af Amer: 27 mL/min — ABNORMAL LOW (ref >60)
GFR, EST AFRICAN AMERICAN: 31 mL/min — AB (ref >60)
Glucose, Bld: 112 mg/dL — ABNORMAL HIGH (ref 65–99)
Potassium: 4.4 mmol/L (ref 3.5–5.1)
SODIUM: 130 mmol/L — AB (ref 135–145)
Total Bilirubin: 0.9 mg/dL (ref 0.3–1.2)
Total Protein: 5.8 g/dL — ABNORMAL LOW (ref 6.5–8.1)

## 2015-05-29 LAB — TSH: TSH: 1.38 u[IU]/mL (ref 0.350–4.500)

## 2015-05-29 LAB — PHOSPHORUS: Phosphorus: 2.6 mg/dL (ref 2.5–4.6)

## 2015-05-29 MED ORDER — SODIUM CHLORIDE 0.9 % IV SOLN
INTRAVENOUS | Status: DC
  Administered 2015-05-29 – 2015-05-30 (×3): via INTRAVENOUS
  Administered 2015-05-30: 1000 mL via INTRAVENOUS

## 2015-05-29 MED ORDER — CETYLPYRIDINIUM CHLORIDE 0.05 % MT LIQD
7.0000 mL | Freq: Two times a day (BID) | OROMUCOSAL | Status: DC
  Administered 2015-05-29 – 2015-06-02 (×9): 7 mL via OROMUCOSAL

## 2015-05-29 MED ORDER — DIPHENHYDRAMINE HCL 25 MG PO CAPS
25.0000 mg | ORAL_CAPSULE | Freq: Once | ORAL | Status: AC
  Administered 2015-05-29: 25 mg via ORAL
  Filled 2015-05-29: qty 1

## 2015-05-29 NOTE — Progress Notes (Signed)
PT Cancellation Note  Patient Details Name: Richard Mcgee MRN: [id number] DOB: [birth year]   Cancelled Treatment:    Reason Eval/Treat Not Completed: Other (comment) (son just assisted  patient OOB, states was  very unsteady, noted very tremulous. getting ready to EAT BREADKFAST. WILL CHECK BACK THIS pm. )   Claretha Cooper 05/29/2015, 10:16 Mark PT [phone]

## 2015-05-29 NOTE — Progress Notes (Signed)
OT Cancellation Note  Patient Details Name: Richard Mcgee MRN: [id number] DOB: [birth year]   Cancelled Treatment:    Reason Eval/Treat Not Completed: Other (comment)   Per PT, Son had gotten pt up to chair.   Will recheck on pt later in day or next day . Betsy Pries  05/29/2015  05/29/2015, 12:30 PM

## 2015-05-29 NOTE — Evaluation (Signed)
Physical Therapy Evaluation Patient Details Name: Richard Mcgee MRN: [id number] DOB: 1928/01/08 Today's Date: 05/29/2015   History of Present Illness  81 yo male admitted 05/28/15  with AMS, fever, sepsis uti. has a past medical history of Hypertension; GERD (; Parkinsonism ; Renal cell cancer; Degenerative disc disease, cervical; Degenerative disc disease, lumbar; Osteoarthritis; Depression; H/O rotator cuff tear; BPH ; Renal insufficiency; and Lung nodules  Clinical Impression  Pt admitted with above diagnosis. Pt currently with functional limitations due to the deficits listed below (see PT Problem List). Pt will benefit from skilled PT to increase their independence and safety with mobility to allow discharge to the venue listed below.   Patient  Presents with significant gait  Instability, tremors and festinating. Per  Family, much different from baseline.     Follow Up Recommendations SNF;Supervision/Assistance - 24 hour    Equipment Recommendations  None recommended by PT    Recommendations for Other Services       Precautions / Restrictions Precautions Precautions: Fall      Mobility  Bed Mobility               General bed mobility comments: inrecliner  Transfers Overall transfer level: Needs assistance Equipment used: 4-wheeled walker Transfers: Sit to/from Stand Sit to Stand: +2 safety/equipment;+2 physical assistance;Max assist         General transfer comment: lifting assist to stand, tactile cues. Performed x 2 from recliner  Ambulation/Gait Ambulation/Gait assistance: Max assist;+2 physical assistance;+2 safety/equipment   Assistive device: 4-wheeled walker Gait Pattern/deviations: Step-to pattern;Staggering left;Staggering right;Decreased weight shift to right;Shuffle;Festinating     General Gait Details: initially ambulated with very shuffling steps x 10', rested and  ambulated x 50' with facilitation for weight shifting, intermittent  festinating  gait,   Stairs            Wheelchair Mobility    Modified Rankin (Stroke Patients Only)       Balance Overall balance assessment: History of Falls;Needs assistance Sitting-balance support: Feet supported;Bilateral upper extremity supported Sitting balance-Leahy Scale: Poor   Postural control: Posterior lean Standing balance support: During functional activity;Bilateral upper extremity supported Standing balance-Leahy Scale: Zero                               Pertinent Vitals/Pain Pain Assessment: Faces Faces Pain Scale: Hurts little more Pain Location: back Pain Descriptors / Indicators: Discomfort;Grimacing Pain Intervention(s): Limited activity within patient's tolerance    Home Living Family/patient expects to be discharged to:: Private residence Living Arrangements: Spouse/significant other;Non-relatives/Friends Available Help at Discharge: Personal care attendant;Family Type of Home: House Home Access: Stairs to enter   [employer] of Steps: 2 Home Layout: One level Home Equipment: Walker - 4 wheels Additional Comments: has  day time caregivers, wife only caregiver at night.    Prior Function Level of Independence: Independent with assistive device(s);Needs assistance   Gait / Transfers Assistance Needed: was ambulating to BR at night  without assist           Hand Dominance        Extremity/Trunk Assessment   Upper Extremity Assessment: Generalized weakness;RUE deficits/detail;LUE deficits/detail RUE Deficits / Details: tremors present     LUE Deficits / Details: same as r   Lower Extremity Assessment: Generalized weakness;RLE deficits/detail;LLE deficits/detail RLE Deficits / Details: bears weight LLE Deficits / Details: bears weight  Cervical / Trunk Assessment: Kyphotic  Communication   Communication: Expressive difficulties (confused)  Cognition Arousal/Alertness: Awake/alert Behavior During Therapy:  Anxious;Restless Overall Cognitive Status: Impaired/Different from baseline Area of Impairment: Orientation;Following commands;Safety/judgement;Awareness Orientation Level: Disoriented to;Place;Time;Situation     Following Commands: Follows one step commands inconsistently       General Comments: daughter in law states very different MS    General Comments      Exercises        Assessment/Plan    PT Assessment Patient needs continued PT services  PT Diagnosis Abnormality of gait;Generalized weakness;Altered mental status   PT Problem List Decreased strength;Decreased range of motion;Decreased activity tolerance;Decreased balance;Decreased mobility;Decreased cognition;Decreased knowledge of precautions;Decreased safety awareness;Decreased knowledge of use of DME  PT Treatment Interventions DME instruction;Gait training;Functional mobility training;Therapeutic activities;Therapeutic exercise;Cognitive remediation;Patient/family education   PT Goals (Current goals can be found in the Care Plan section) Acute Rehab PT Goals Patient Stated Goal: per daughter, to walk. PT Goal Formulation: With family Time For Goal Achievement: 06/12/15 Potential to Achieve Goals: Fair    Frequency Min 3X/week   Barriers to discharge Decreased caregiver support      Co-evaluation               End of Session Equipment Utilized During Treatment: Gait belt Activity Tolerance: Patient tolerated treatment well Patient left: in chair;with call bell/phone within reach;with chair alarm set;with family/visitor present Nurse Communication: Mobility status         Time: [id number] PT Time Calculation (min) (ACUTE ONLY): 22 min   Charges:   PT Evaluation $Initial PT Evaluation Tier I: 1 Procedure     PT G CodesClaretha Cooper 05/29/2015, 5:44 PM

## 2015-05-29 NOTE — Progress Notes (Signed)
[facility] PROGRESS NOTE   Richard Mcgee [id number] DOB: 04/10/1928 DOA: 05/28/2015 PCP: Mathews Argyle, MD  HPI/Subjective: Seen with son at bedside (son is Zackary, [place] with [facility]), slightly confused. Per son this is not his baseline.  Assessment/Plan: Principal Problem:   Sepsis ([place]) Active Problems:   Renal cell carcinoma of right kidney metastatic to other site [facility])   Parkinson's disease (tremor, stiffness, slow motion, unstable posture) (HCC)   CKD (chronic kidney disease), stage III   UTI (lower urinary tract infection)   Acute encephalopathy   Chronic diastolic CHF (congestive heart failure), NYHA class 1 (HCC)   Sepsis Met sepsis criteria with temperature of 101.8 and WBC of 19K and presence of infection. Hemodynamically stable, lactate is normal. This is likely secondary to UTI. Started on IV antibiotics for UTI and IV fluids for hydration.  UTI Urinalysis consistent with UTI, started on Rocephin. We'll adjust antibiotics according to the culture results.  Hyponatremia Likely secondary to dehydration from sepsis, insensible losses from fever. Started on normal saline at 75, check BMP in a.m.  Acute encephalopathy Acute metabolic encephalopathy likely secondary to UTI/sepsis. This is will improve as the infection improving with antibiotics, follow clinically.   Renal cell carcinoma of right kidney metastatic to other site Stable monitor creatinine   Parkinson's disease Continue home medications PT/OT evaluation prior to discharge for safety   CKD (chronic kidney disease), stage III Chronic creatinine currently at baseline continue to monitor   Chronic diastolic CHF (congestive heart failure) Chronic old compensated, patient will be on trickle IV fluids at 75 mL, follow closely.  Code Status: DNR Family Communication: Plan discussed with the patient. Disposition Plan: Remains inpatient Diet: Diet Heart Room service  appropriate?: Yes; Fluid consistency:: Thin  Consultants:  None  Procedures:  None  Antibiotics:  Rocephin   Objective: Filed Vitals:   05/29/15 0436 05/29/15 0636  BP: 139/68   Pulse: 83   Temp: 101.5 F (38.6 C) 100.7 F (38.2 C)  Resp:      Intake/Output Summary (Last 24 hours) at 05/29/15 1023 Last data filed at 05/29/15 0700  Gross per 24 hour  Intake  647.5 ml  Output      2 ml  Net  645.5 ml   Filed Weights   05/28/15 1509 05/28/15 2048  Weight: 63.504 kg (140 lb) 64.9 kg (143 lb 1.3 oz)    Exam: General: Alert and awake, oriented x3, not in any acute distress. HEENT: anicteric sclera, pupils reactive to light and accommodation, EOMI CVS: S1-S2 clear, no murmur rubs or gallops Chest: clear to auscultation bilaterally, no wheezing, rales or rhonchi Abdomen: soft nontender, nondistended, normal bowel sounds, no organomegaly Extremities: no cyanosis, clubbing or edema noted bilaterally Neuro: Cranial nerves II-XII intact, no focal neurological deficits  Data Reviewed: Basic Metabolic Panel:  Recent Labs Lab 05/28/15 1547 05/29/15 0416  NA 136 130*  K 5.0 4.4  CL 106 101  CO2 23 21*  GLUCOSE 131* 112*  BUN 43* 43*  CREATININE 2.13* 2.12*  CALCIUM 8.4* 8.1*  MG  --  1.8  PHOS  --  2.6   Liver Function Tests:  Recent Labs Lab 05/28/15 1547 05/29/15 0416  AST 17 13*  ALT <5* <5*  ALKPHOS 82 61  BILITOT 0.9 0.9  PROT 6.4* 5.8*  ALBUMIN 3.9 3.5   No results for input(s): LIPASE, AMYLASE in the last 168 hours. No results for input(s): AMMONIA in the last 168 hours. CBC:  Recent Labs Lab  05/28/15 1547 05/29/15 0416  WBC 19.7* 20.0*  NEUTROABS 16.9*  --   HGB 11.5* 10.8*  HCT 34.3* 32.8*  MCV 95.5 95.1  PLT 175 149*   Cardiac Enzymes: No results for input(s): CKTOTAL, CKMB, CKMBINDEX, TROPONINI in the last 168 hours. BNP (last 3 results) No results for input(s): BNP in the last 8760 hours.  ProBNP (last 3 results) No  results for input(s): PROBNP in the last 8760 hours.  CBG: No results for input(s): GLUCAP in the last 168 hours.  Micro Recent Results (from the past 240 hour(s))  Culture, blood (routine x 2)     Status: None (Preliminary result)   Collection Time: 05/28/15  3:47 PM  Result Value Ref Range Status   Specimen Description BLOOD BLOOD LEFT ARM  Final   Special Requests BOTTLES DRAWN AEROBIC AND ANAEROBIC 5CC  Final   Culture   Final    NO GROWTH < 24 HOURS Performed at [facility]    Report Status PENDING  Incomplete     Studies: Dg Chest 2 View  05/28/2015  CLINICAL DATA:  Shortness of breath today. EXAM: CHEST  2 VIEW COMPARISON:  05/02/2015 and 11/11/2014 FINDINGS: Lungs are adequately inflated without focal consolidation or effusion. Mild stable cardiomegaly. Calcified plaque over the thoracoabdominal aorta. Mild anterior wedging of a mid thoracic vertebral body and vertebral body near the thoracolumbar junction unchanged. IMPRESSION: No active cardiopulmonary disease. Mild cardiomegaly. Stable anterior wedge compression deformities of the spine. Electronically Signed   By: Marin Olp M.D.   On: 05/28/2015 15:34    Scheduled Meds: . antiseptic oral rinse  7 mL Mouth Rinse BID  . carbidopa-levodopa  2 tablet Oral TID  . cefTRIAXone (ROCEPHIN)  IV  1 g Intravenous Q24H  . docusate sodium  50 mg Oral BID  . enoxaparin (LOVENOX) injection  30 mg Subcutaneous Q24H   Continuous Infusions: . sodium chloride 75 mL/hr at 05/29/15 1000       Time spent: 35 minutes    [facility]  [facility] Pager [phone] If 7PM-7AM, please contact night-coverage at [email or web address], password [facility] 05/29/2015, 10:23 AM  LOS: 1 day

## 2015-05-30 LAB — URINE CULTURE

## 2015-05-30 MED ORDER — FIBER 7 PO POWD: 1.0000 | Freq: Every day | ORAL | Status: DC

## 2015-05-30 MED ORDER — PSYLLIUM 95 % PO PACK
1.0000 | PACK | Freq: Every day | ORAL | Status: DC
  Administered 2015-05-30 – 2015-06-02 (×4): 1 via ORAL
  Filled 2015-05-30 (×4): qty 1

## 2015-05-30 MED ORDER — DEXTROSE 5 % IV SOLN
1.0000 g | INTRAVENOUS | Status: DC
  Administered 2015-05-30: 1 g via INTRAVENOUS
  Filled 2015-05-30 (×2): qty 10

## 2015-05-30 MED ORDER — DIPHENHYDRAMINE HCL 25 MG PO CAPS: 25.0000 mg | ORAL_CAPSULE | Freq: Once | ORAL | Status: DC

## 2015-05-30 NOTE — Clinical Social Work Note (Signed)
Clinical Social Work Assessment  Patient Details  Name: Richard Mcgee MRN: [id number] Date of Birth: 06-Jul-1927  Date of referral:  05/30/15               Reason for consult:  Discharge Planning                Permission sought to share information with:  Family Supports Permission granted to share information::  Yes, Verbal Permission Granted  Name::     Richard Mcgee  Agency::     Relationship::  wife  Contact Information:  [phone]  Housing/Transportation Living arrangements for the past 2 months:  Single Family Home Source of Information:  Spouse Patient Interpreter Needed:  None Criminal Activity/Legal Involvement Pertinent to Current Situation/Hospitalization:  No - Comment as needed Significant Relationships:  Adult Children, Spouse Lives with:  Spouse Do you feel safe going back to the place where you live?  No Need for family participation in patient care:  Yes (Comment)  Care giving concerns:  Pt admitted from home with pt wife. PT recommending ST SNF.   Social Worker assessment / plan:  CSW received referral for new SNF.   CSW met with pt, pt wife, and pt daughter-in-law at bedside. Pt oriented to person only. Pt wife discussed that pt walked with a walker prior to admission and pt wife did not have difficulty managing pt at home. CSW discussed recommendation for SNF for rehab and pt wife agreeable.  CSW discussed process of SNF and pt wife agreeable to [facility] search. 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   CSW completed FL2 and initiated SNF search to [facility]. CSW contacted [facility] to notify of pt family interest. [facility] will review pt information.   CSW to follow up with pt wife re: SNF bed offers.  CSW to continue to follow to provide support and assist with pt disposition needs.   Employment status:  Retired Radiation protection practitioner:  Medicare PT Recommendations:  [place] / Referral to community resources:  [place]  Patient/Family's Response to care:  Pt alert and oriented to person. Pt family supportive and actively involved in pt care. Pt family hopeful for [facility] for pt short term rehab needs.  Patient/Family's Understanding of and Emotional Response to Diagnosis, Current Treatment, and Prognosis:  Pt family displayed knowledge surrounding pt diagnosis and treatment plan.   Emotional Assessment Appearance:  Appears stated age Attitude/Demeanor/Rapport:  Other (pt appropriate and quiet) Affect (typically observed):  Quiet, Unable to Assess (alert and oriented to person only) Orientation:  Oriented to Self Alcohol / Substance use:  Not Applicable Psych involvement (Current and /or in the community):  No (Comment)  Discharge Needs  Concerns to be addressed:  Discharge Planning Concerns Readmission within the last 30 days:  No Current discharge risk:  Physical Impairment Barriers to Discharge:  Continued Medical Work up   Alison Murray A, LCSW 05/30/2015, 1:42 PM  [phone]

## 2015-05-30 NOTE — Progress Notes (Signed)
OT Cancellation Note  Patient Details Name: Richard Mcgee MRN: [id number] DOB: [birth year]   Cancelled Treatment:   Noted plans for SNF- will defer OT eval to SNF Leslie, Thereasa Parkin 05/30/2015, 1:46 PM

## 2015-05-30 NOTE — Clinical Social Work Placement (Signed)
   CLINICAL SOCIAL WORK PLACEMENT  NOTE  Date:  05/30/2015  Patient Details  Name: Richard Mcgee MRN: [id number] Date of Birth: 1928-05-07  Clinical Social Work is seeking post-discharge placement for this patient at the [place] level of care (*CSW will initial, date and re-position this form in  chart as items are completed):  Yes   Patient/family provided with [place] Work Department's list of facilities offering this level of care within the geographic area requested by the patient (or if unable, by the patient's family).  Yes   Patient/family informed of their freedom to choose among providers that offer the needed level of care, that participate in Medicare, Medicaid or managed care program needed by the patient, have an available bed and are willing to accept the patient.  Yes   Patient/family informed of [place]'s ownership interest in [facility] and [facility], as well as of the fact that they are under no obligation to receive care at these facilities.  PASRR submitted to EDS on 05/30/15     PASRR number received on       Existing PASRR number confirmed on       FL2 transmitted to all facilities in geographic area requested by pt/family on 05/30/15     FL2 transmitted to all facilities within larger geographic area on       Patient informed that his/her managed care company has contracts with or will negotiate with certain facilities, including the following:            Patient/family informed of bed offers received.  Patient chooses bed at       Physician recommends and patient chooses bed at      Patient to be transferred to   on  .  Patient to be transferred to facility by       Patient family notified on   of transfer.  Name of family member notified:        PHYSICIAN Please sign FL2, Please sign DNR     Additional Comment:    _______________________________________________ [name],  LCSW 05/30/2015, 1:16 PM

## 2015-05-30 NOTE — Plan of Care (Signed)
Problem: Safety: Goal: Ability to remain free from injury will improve Outcome: Progressing Bed alarm on   

## 2015-05-30 NOTE — NC FL2 (Signed)
[place] LEVEL OF CARE SCREENING TOOL     IDENTIFICATION  Patient Name: Richard Mcgee Birthdate: 1928/05/07 Sex: male Admission Date (Current Location): 05/28/2015  [facility] [place] and [place] Number: Herbalist and Address:  [facility],  [place] [address] [place]      Provider Number: [id number]  Attending Physician Name and Address:  Verlee Monte, MD  Relative Name and Phone Number:       Current Level of Care: Hospital Recommended Level of Care: [facility] Prior Approval Number:    Date Approved/Denied:   PASRR Number:    Discharge Plan: SNF    Current Diagnoses: Patient Active Problem List   Diagnosis Date Noted  . UTI (lower urinary tract infection) 05/28/2015  . Acute encephalopathy 05/28/2015  . Chronic diastolic CHF (congestive heart failure), NYHA class 1 ([place]) 05/28/2015  . Sepsis ([place]) 05/28/2015  . 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 Visual hallucinations 11/12/2014  . Renal cell carcinoma of right kidney metastatic to other site ([place]) 11/11/2014  . Parkinson's disease (tremor, stiffness, slow motion, unstable posture) ([place]) 11/11/2014  . CKD (chronic kidney disease), stage III 11/11/2014    Orientation ACTIVITIES/SOCIAL BLADDER RESPIRATION    Self    Incontinent (incontinent at times) Normal  BEHAVIORAL SYMPTOMS/MOOD NEUROLOGICAL BOWEL NUTRITION STATUS      Continent Diet (Diet Heart)  PHYSICIAN VISITS COMMUNICATION OF NEEDS Height & Weight Skin    Verbally 5\' 6"  (167.6 cm) 143 lbs. Normal          AMBULATORY STATUS RESPIRATION    Assist extensive Normal      Personal Care Assistance Level of Assistance  Bathing, Dressing, Feeding Bathing Assistance: Maximum assistance Feeding assistance: Independent Dressing Assistance: Maximum assistance      Functional Limitations Info  Sight, Hearing, Speech Sight Info: Impaired Hearing Info: Adequate Speech Info:  Adequate       SPECIAL CARE FACTORS FREQUENCY  PT (By licensed PT), OT (By licensed OT)     PT Frequency: 5 x a week OT Frequency:  (5 x a week)           Additional Factors Info  Code Status, Allergies Code Status Info: DNR code status Allergies Info: Sulfa Antibiotics           Current Medications (05/30/2015):  This is the current hospital active medication list Current Facility-Administered Medications  Medication Dose Route Frequency Provider Last Rate Last Dose  . 0.9 %  sodium chloride infusion   Intravenous Continuous Verlee Monte, MD 75 mL/hr at 05/30/15 0343 1,000 mL at 05/30/15 0343  . acetaminophen (TYLENOL) tablet 650 mg  650 mg Oral Q6H PRN Toy Baker, MD   650 mg at 05/30/15 [id number]   Or  . acetaminophen (TYLENOL) suppository 650 mg  650 mg Rectal Q6H PRN Toy Baker, MD      . antiseptic oral rinse (CPC / CETYLPYRIDINIUM CHLORIDE 0.05%) solution 7 mL  7 mL Mouth Rinse BID Verlee Monte, MD   7 mL at 05/30/15 1000  . carbidopa-levodopa (SINEMET IR) 25-100 MG per tablet immediate release 2 tablet  2 tablet Oral TID Toy Baker, MD   2 tablet at 05/30/15 0939  . cefTRIAXone (ROCEPHIN) 1 g in dextrose 5 % 50 mL IVPB  1 g Intravenous Q24H Verlee Monte, MD      . docusate sodium (COLACE) capsule 50 mg  50 mg Oral BID Toy Baker, MD   50 mg at 05/30/15 0939  .  enoxaparin (LOVENOX) injection 30 mg  30 mg Subcutaneous Q24H Toy Baker, MD   30 mg at 05/29/15 2031  . HYDROcodone-acetaminophen (NORCO/VICODIN) 5-325 MG per tablet 1-2 tablet  1-2 tablet Oral Q4H PRN Toy Baker, MD      . ondansetron (ZOFRAN) tablet 4 mg  4 mg Oral Q6H PRN Toy Baker, MD       Or  . ondansetron (ZOFRAN) injection 4 mg  4 mg Intravenous Q6H PRN Toy Baker, MD      . polyethylene glycol (MIRALAX / GLYCOLAX) packet 17 g  17 g Oral Daily PRN Toy Baker, MD   17 g at 05/30/15 0715  . psyllium (HYDROCIL/METAMUCIL) packet 1 packet  1  packet Oral Daily Verlee Monte, MD         Discharge Medications: Please see discharge summary for a list of discharge medications.  Relevant Imaging Results:  Relevant Lab Results:  Recent Labs    Additional Information SSN: [id number]   Joann Jorge, [place] Better A, LCSW

## 2015-05-30 NOTE — Progress Notes (Addendum)
[facility] PROGRESS NOTE   Davin Archuletta [id number] DOB: 1927/08/23 DOA: 05/28/2015 PCP: Mathews Argyle, MD  HPI/Subjective: Seen with daughter low at bedside. Continues to spike temperature highest 102.4 last night at 2043.  Assessment/Plan: Principal Problem:   Sepsis ([place]) Active Problems:   Renal cell carcinoma of right kidney metastatic to other site [facility])   Parkinson's disease (tremor, stiffness, slow motion, unstable posture) (HCC)   CKD (chronic kidney disease), stage III   UTI (lower urinary tract infection)   Acute encephalopathy   Chronic diastolic CHF (congestive heart failure), NYHA class 1 (HCC)   Sepsis Met sepsis criteria with temperature of 101.8 and WBC of 19K and presence of infection. Hemodynamically stable, lactate is normal. This is likely secondary to UTI. Started on IV antibiotics for UTI and IV fluids for hydration.   Escherichia coli UTI Urinalysis consistent with UTI, started on Rocephin. Patient continues to have fever, repeat UA/culture, continue Rocephin if continues to have fever today I will switch to Primaxin.  Hyponatremia Likely secondary to dehydration from sepsis, insensible losses from fever. Started on normal saline at 75, BMP ordered but not done for some reason. Check BMP again in a.m.  Acute encephalopathy Acute metabolic encephalopathy likely secondary to UTI/sepsis. This is resolved, patient is wide awake and alert today.  Renal cell carcinoma of right kidney metastatic to other site Stable monitor creatinine   Parkinson's disease Continue home medications PT/OT evaluation prior to discharge for safety   CKD (chronic kidney disease), stage III Chronic creatinine currently at baseline continue to monitor   Chronic diastolic CHF (congestive heart failure) Chronic old compensated, patient will be on trickle IV fluids at 75 mL, follow closely.  Code Status: DNR Family Communication: Plan discussed with the  patient. Disposition Plan: Remains inpatient Diet: Diet Heart Room service appropriate?: Yes; Fluid consistency:: Thin  Consultants:  None  Procedures:  None  Antibiotics:  Rocephin   Objective: Filed Vitals:   05/30/15 0610 05/30/15 1032  BP: 152/74   Pulse: 94   Temp: 101.4 F (38.6 C) 98.5 F (36.9 C)  Resp: 18     Intake/Output Summary (Last 24 hours) at 05/30/15 1049 Last data filed at 05/30/15 0618  Gross per 24 hour  Intake 1982.5 ml  Output      0 ml  Net 1982.5 ml   Filed Weights   05/28/15 1509 05/28/15 2048  Weight: 63.504 kg (140 lb) 64.9 kg (143 lb 1.3 oz)    Exam: General: Alert and awake, oriented x3, not in any acute distress. HEENT: anicteric sclera, pupils reactive to light and accommodation, EOMI CVS: S1-S2 clear, no murmur rubs or gallops Chest: clear to auscultation bilaterally, no wheezing, rales or rhonchi Abdomen: soft nontender, nondistended, normal bowel sounds, no organomegaly Extremities: no cyanosis, clubbing or edema noted bilaterally Neuro: Cranial nerves II-XII intact, no focal neurological deficits  Data Reviewed: Basic Metabolic Panel:  Recent Labs Lab 05/28/15 1547 05/29/15 0416  NA 136 130*  K 5.0 4.4  CL 106 101  CO2 23 21*  GLUCOSE 131* 112*  BUN 43* 43*  CREATININE 2.13* 2.12*  CALCIUM 8.4* 8.1*  MG  --  1.8  PHOS  --  2.6   Liver Function Tests:  Recent Labs Lab 05/28/15 1547 05/29/15 0416  AST 17 13*  ALT <5* <5*  ALKPHOS 82 61  BILITOT 0.9 0.9  PROT 6.4* 5.8*  ALBUMIN 3.9 3.5   No results for input(s): LIPASE, AMYLASE in the last 168 hours. No  results for input(s): AMMONIA in the last 168 hours. CBC:  Recent Labs Lab 05/28/15 1547 05/29/15 0416  WBC 19.7* 20.0*  NEUTROABS 16.9*  --   HGB 11.5* 10.8*  HCT 34.3* 32.8*  MCV 95.5 95.1  PLT 175 149*   Cardiac Enzymes: No results for input(s): CKTOTAL, CKMB, CKMBINDEX, TROPONINI in the last 168 hours. BNP (last 3 results) No results  for input(s): BNP in the last 8760 hours.  ProBNP (last 3 results) No results for input(s): PROBNP in the last 8760 hours.  CBG: No results for input(s): GLUCAP in the last 168 hours.  Micro Recent Results (from the past 240 hour(s))  Culture, blood (routine x 2)     Status: None (Preliminary result)   Collection Time: 05/28/15  3:47 PM  Result Value Ref Range Status   Specimen Description BLOOD BLOOD LEFT ARM  Final   Special Requests BOTTLES DRAWN AEROBIC AND ANAEROBIC 5CC  Final   Culture   Final    NO GROWTH < 24 HOURS Performed at [facility]    Report Status PENDING  Incomplete  Urine culture     Status: None   Collection Time: 05/28/15  3:47 PM  Result Value Ref Range Status   Specimen Description URINE, CLEAN CATCH  Final   Special Requests NONE  Final   Culture   Final    >=100,000 COLONIES/mL ESCHERICHIA COLI Performed at [facility]    Report Status 05/30/2015 FINAL  Final   Organism ID, Bacteria ESCHERICHIA COLI  Final      Susceptibility   Escherichia coli - MIC*    AMPICILLIN <=2 SENSITIVE Sensitive     CEFAZOLIN <=4 SENSITIVE Sensitive     CEFTRIAXONE <=1 SENSITIVE Sensitive     CIPROFLOXACIN <=0.25 SENSITIVE Sensitive     GENTAMICIN <=1 SENSITIVE Sensitive     IMIPENEM <=0.25 SENSITIVE Sensitive     NITROFURANTOIN <=16 SENSITIVE Sensitive     TRIMETH/SULFA <=20 SENSITIVE Sensitive     AMPICILLIN/SULBACTAM <=2 SENSITIVE Sensitive     PIP/TAZO <=4 SENSITIVE Sensitive     * >=100,000 COLONIES/mL ESCHERICHIA COLI     Studies: Dg Chest 2 View  05/28/2015  CLINICAL DATA:  Shortness of breath today. EXAM: CHEST  2 VIEW COMPARISON:  05/02/2015 and 11/11/2014 FINDINGS: Lungs are adequately inflated without focal consolidation or effusion. Mild stable cardiomegaly. Calcified plaque over the thoracoabdominal aorta. Mild anterior wedging of a mid thoracic vertebral body and vertebral body near the thoracolumbar junction unchanged. IMPRESSION: No  active cardiopulmonary disease. Mild cardiomegaly. Stable anterior wedge compression deformities of the spine. Electronically Signed   By: Marin Olp M.D.   On: 05/28/2015 15:34    Scheduled Meds: . antiseptic oral rinse  7 mL Mouth Rinse BID  . carbidopa-levodopa  2 tablet Oral TID  . docusate sodium  50 mg Oral BID  . enoxaparin (LOVENOX) injection  30 mg Subcutaneous Q24H  . FIBER 7  1 packet Oral Daily   Continuous Infusions: . sodium chloride 1,000 mL (05/30/15 0343)       Time spent: 35 minutes    [facility] A  [facility] Pager [phone] If 7PM-7AM, please contact night-coverage at [email or web address], password [facility] 05/30/2015, 10:49 AM  LOS: 2 days

## 2015-05-30 NOTE — Progress Notes (Signed)
Patient febrile earlier. Given tylenol with fever reducing and patient now sweating.

## 2015-05-30 NOTE — Progress Notes (Signed)
Repeated tylenol this am with fever. Patient with tremors earlier when oob to [facility] this am.

## 2015-05-31 ENCOUNTER — Inpatient Hospital Stay (HOSPITAL_COMMUNITY): Payer: Medicare Other

## 2015-05-31 DIAGNOSIS — G20 Parkinson's disease: Secondary | ICD-10-CM

## 2015-05-31 DIAGNOSIS — Z7189 Other specified counseling: Secondary | ICD-10-CM

## 2015-05-31 DIAGNOSIS — R509 Fever, unspecified: Secondary | ICD-10-CM

## 2015-05-31 DIAGNOSIS — B9620 Unspecified Escherichia coli [E. coli] as the cause of diseases classified elsewhere: Secondary | ICD-10-CM

## 2015-05-31 DIAGNOSIS — G9340 Encephalopathy, unspecified: Secondary | ICD-10-CM

## 2015-05-31 DIAGNOSIS — N390 Urinary tract infection, site not specified: Secondary | ICD-10-CM

## 2015-05-31 DIAGNOSIS — C641 Malignant neoplasm of right kidney, except renal pelvis: Secondary | ICD-10-CM

## 2015-05-31 DIAGNOSIS — Z905 Acquired absence of kidney: Secondary | ICD-10-CM

## 2015-05-31 LAB — BASIC METABOLIC PANEL
Anion gap: 6 (ref 5–15)
BUN: 48 mg/dL — ABNORMAL HIGH (ref 6–20)
CALCIUM: 8.3 mg/dL — AB (ref 8.9–10.3)
CO2: 22 mmol/L (ref 22–32)
CREATININE: 2.1 mg/dL — AB (ref 0.61–1.24)
Chloride: 107 mmol/L (ref 101–111)
GFR calc non Af Amer: 27 mL/min — ABNORMAL LOW (ref >60)
GFR, EST AFRICAN AMERICAN: 31 mL/min — AB (ref >60)
GLUCOSE: 109 mg/dL — AB (ref 65–99)
Potassium: 4.7 mmol/L (ref 3.5–5.1)
Sodium: 135 mmol/L (ref 135–145)

## 2015-05-31 LAB — CBC
HEMATOCRIT: 32.1 % — AB (ref 39.0–52.0)
HEMOGLOBIN: 10.7 g/dL — AB (ref 13.0–17.0)
MCH: 32.1 pg (ref 26.0–34.0)
MCHC: 33.3 g/dL (ref 30.0–36.0)
MCV: 96.4 fL (ref 78.0–100.0)
Platelets: 136 10*3/uL — ABNORMAL LOW (ref 150–400)
RBC: 3.33 MIL/uL — AB (ref 4.22–5.81)
RDW: 14.1 % (ref 11.5–15.5)
WBC: 6.8 10*3/uL (ref 4.0–10.5)

## 2015-05-31 LAB — URINE MICROSCOPIC-ADD ON: Bacteria, UA: NONE SEEN

## 2015-05-31 LAB — URINALYSIS, ROUTINE W REFLEX MICROSCOPIC
Bilirubin Urine: NEGATIVE
GLUCOSE, UA: NEGATIVE mg/dL
KETONES UR: NEGATIVE mg/dL
NITRITE: NEGATIVE
PH: 5.5 (ref 5.0–8.0)
Protein, ur: 100 mg/dL — AB
SPECIFIC GRAVITY, URINE: 1.019 (ref 1.005–1.030)

## 2015-05-31 MED ORDER — SODIUM CHLORIDE 0.9 % IV SOLN
250.0000 mg | Freq: Three times a day (TID) | INTRAVENOUS | Status: DC
  Administered 2015-05-31: 250 mg via INTRAVENOUS
  Filled 2015-05-31 (×2): qty 250

## 2015-05-31 MED ORDER — VANCOMYCIN HCL IN DEXTROSE 750-5 MG/150ML-% IV SOLN
750.0000 mg | INTRAVENOUS | Status: DC
Start: 2015-06-01 — End: 2015-05-31

## 2015-05-31 MED ORDER — VANCOMYCIN HCL 10 G IV SOLR
1250.0000 mg | Freq: Once | INTRAVENOUS | Status: AC
  Administered 2015-05-31: 1250 mg via INTRAVENOUS
  Filled 2015-05-31: qty 1250

## 2015-05-31 MED ORDER — CEFTRIAXONE SODIUM 1 G IJ SOLR
1.0000 g | INTRAMUSCULAR | Status: DC
  Administered 2015-05-31 – 2015-06-01 (×2): 1 g via INTRAVENOUS
  Filled 2015-05-31 (×3): qty 10

## 2015-05-31 MED ORDER — FUROSEMIDE 10 MG/ML IJ SOLN
40.0000 mg | Freq: Once | INTRAMUSCULAR | Status: DC
Start: 2015-05-31 — End: 2015-06-01

## 2015-05-31 MED ORDER — ALBUTEROL SULFATE (2.5 MG/3ML) 0.083% IN NEBU
5.0000 mg | INHALATION_SOLUTION | Freq: Once | RESPIRATORY_TRACT | Status: AC
  Administered 2015-05-31: 5 mg via RESPIRATORY_TRACT
  Filled 2015-05-31: qty 6

## 2015-05-31 MED ORDER — IPRATROPIUM BROMIDE 0.02 % IN SOLN
0.5000 mg | Freq: Once | RESPIRATORY_TRACT | Status: AC
  Administered 2015-05-31: 0.5 mg via RESPIRATORY_TRACT
  Filled 2015-05-31: qty 2.5

## 2015-05-31 MED ORDER — FUROSEMIDE 10 MG/ML IJ SOLN
20.0000 mg | Freq: Once | INTRAMUSCULAR | Status: AC
  Administered 2015-05-31: 20 mg via INTRAVENOUS
  Filled 2015-05-31: qty 2

## 2015-05-31 NOTE — Progress Notes (Signed)
Physical Therapy Treatment Patient Details Name: Richard Mcgee MRN: [id number] DOB: Apr 27, 1928 Today's Date: 05/31/2015    History of Present Illness 81 yo male admitted 05/28/15  with AMS, fever, sepsis uti. has a past medical history of Hypertension; GERD (gastroesophageal reflux disease); Parkinsonism ([place]); Renal cell cancer ([place]) (2010); Degenerative disc disease, cervical; Degenerative disc disease, lumbar; Osteoarthritis; Depression; H/O rotator cuff tear; BPH (benign prostatic hyperplasia); Renal insufficiency; and Lung nodules    PT Comments    Patient's gait is much improved today. Ambulated 100' x 2 with 4 wheeled RW. Sats 90%  On RA for ambulation.    Follow Up Recommendations  SNF;Supervision/Assistance - 24 hour     Equipment Recommendations  None recommended by PT    Recommendations for Other Services       Precautions / Restrictions Precautions Precautions: Fall Precaution Comments: monitor sats    Mobility  Bed Mobility Overal bed mobility: Needs Assistance Bed Mobility: Supine to Sit     Supine to sit: Mod assist;+2 for physical assistance;+2 for safety/equipment     General bed mobility comments: assist with legs and trunk  Transfers Overall transfer level: Needs assistance Equipment used: 4-wheeled walker Transfers: Sit to/from Stand Sit to Stand: +2 safety/equipment;+2 physical assistance;Mod assist         General transfer comment: cues for safety and for hand placement, is not demonstrating safe use of RW as he would have PTA, multimodal cues for standing and sitting, locking brakes, reaching back to recliner.  Ambulation/Gait Ambulation/Gait assistance: Min assist;+2 safety/equipment Ambulation Distance (Feet): 100 Feet (x 2) Assistive device: 4-wheeled walker Gait Pattern/deviations: Step-through pattern Gait velocity: fast   General Gait Details: patient demonstrates improved gait, not shuffling  but with mild festinatingtoday. Noted   to actually 'Jog" a few feet but cautioned patient to  slow down. Gait speed is increased today.   Stairs            Wheelchair Mobility    Modified Rankin (Stroke Patients Only)       Balance Overall balance assessment: Needs assistance;History of Falls Sitting-balance support: Bilateral upper extremity supported;Feet supported Sitting balance-Leahy Scale: Fair     Standing balance support: Bilateral upper extremity supported;During functional activity Standing balance-Leahy Scale: Poor                      Cognition Arousal/Alertness: Awake/alert Behavior During Therapy: WFL for tasks assessed/performed;Flat affect           Following Commands: Follows one step commands inconsistently;Follows one step commands with increased time       General Comments: patient is  much more alert, calm and less restless.    Exercises      General Comments        Pertinent Vitals/Pain Pain Assessment: No/denies pain    Home Living                      Prior Function            PT Goals (current goals can now be found in the care plan section) Progress towards PT goals: Progressing toward goals    Frequency  Min 3X/week    PT Plan Current plan remains appropriate    Co-evaluation             End of Session Equipment Utilized During Treatment: Gait belt Activity Tolerance: Patient tolerated treatment well Patient left: in chair;with call bell/phone within reach;with chair alarm set;with family/visitor  present     Time: 1155-1221 PT Time Calculation (min) (ACUTE ONLY): 26 min  Charges:  $Gait Training: 23-37 mins                    G Codes:      Claretha Cooper 05/31/2015, 12:46 PM Tresa Endo PT [phone]

## 2015-05-31 NOTE — Progress Notes (Signed)
O2 sat 89 % on room air this am. Placed on 1l [place] with sats increasing. Patient with cough.

## 2015-05-31 NOTE — Progress Notes (Signed)
[facility] PROGRESS NOTE   Richard Mcgee [id number] DOB: 02-Oct-1927 DOA: 05/28/2015 PCP: Mathews Argyle, MD  HPI/Subjective: Seen with daughter-in-law at bedside. He had another fever of 101.7 earlier today, change antibiotics to Primaxin and vancomycin, repeated urine culture.  Assessment/Plan: Principal Problem:   Sepsis ([place]) Active Problems:   Renal cell carcinoma of right kidney metastatic to other site [facility])   Parkinson's disease (tremor, stiffness, slow motion, unstable posture) (HCC)   CKD (chronic kidney disease), stage III   UTI (lower urinary tract infection)   Acute encephalopathy   Chronic diastolic CHF (congestive heart failure), NYHA class 1 (HCC)   Sepsis Met sepsis criteria with temperature of 101.8 and WBC of 19K and presence of infection. Hemodynamically stable, lactate is normal. This is likely secondary to UTI. Started on IV antibiotics for UTI and IV fluids for hydration.  Escherichia coli UTI Urinalysis consistent with UTI, started on Rocephin. Pansensitive Escherichia coli UTI, patient was on Rocephin continued spike fevers. Repeated urine culture today, change antibiotics to Primaxin and vancomycin.  Hyponatremia Likely secondary to dehydration from sepsis, insensible losses from fever. Started on normal saline at 75 mL/hr, BMP again in a.m.  Acute encephalopathy Acute metabolic encephalopathy likely secondary to UTI/sepsis. This is resolved, patient is wide awake and alert today.  Renal cell carcinoma of right kidney metastatic to other site Stable monitor creatinine   Parkinson's disease Continue home medications PT/OT evaluation prior to discharge for safety   CKD (chronic kidney disease), stage III Chronic creatinine currently at baseline continue to monitor   Chronic diastolic CHF (congestive heart failure) Chronic old compensated, patient will be on trickle IV fluids at 75 mL, follow closely.   Code Status:  DNR Family Communication: Plan discussed with the patient. Disposition Plan: Remains inpatient Diet: Diet regular Room service appropriate?: Yes; Fluid consistency:: Thin  Consultants:  None  Procedures:  None  Antibiotics:  Rocephin   Objective: Filed Vitals:   05/31/15 0654 05/31/15 1318  BP: 128/70 148/88  Pulse: 88 86  Temp: 101.7 F (38.7 C) 98 F (36.7 C)  Resp: 20 20    Intake/Output Summary (Last 24 hours) at 05/31/15 1409 Last data filed at 05/31/15 0700  Gross per 24 hour  Intake 2366.25 ml  Output      0 ml  Net 2366.25 ml   Filed Weights   05/28/15 1509 05/28/15 2048  Weight: 63.504 kg (140 lb) 64.9 kg (143 lb 1.3 oz)    Exam: General: Alert and awake, oriented x3, not in any acute distress. HEENT: anicteric sclera, pupils reactive to light and accommodation, EOMI CVS: S1-S2 clear, no murmur rubs or gallops Chest: clear to auscultation bilaterally, no wheezing, rales or rhonchi Abdomen: soft nontender, nondistended, normal bowel sounds, no organomegaly Extremities: no cyanosis, clubbing or edema noted bilaterally Neuro: Cranial nerves II-XII intact, no focal neurological deficits  Data Reviewed: Basic Metabolic Panel:  Recent Labs Lab 05/28/15 1547 05/29/15 0416 05/31/15 0410  NA 136 130* 135  K 5.0 4.4 4.7  CL 106 101 107  CO2 23 21* 22  GLUCOSE 131* 112* 109*  BUN 43* 43* 48*  CREATININE 2.13* 2.12* 2.10*  CALCIUM 8.4* 8.1* 8.3*  MG  --  1.8  --   PHOS  --  2.6  --    Liver Function Tests:  Recent Labs Lab 05/28/15 1547 05/29/15 0416  AST 17 13*  ALT <5* <5*  ALKPHOS 82 61  BILITOT 0.9 0.9  PROT 6.4* 5.8*  ALBUMIN 3.9  3.5   No results for input(s): LIPASE, AMYLASE in the last 168 hours. No results for input(s): AMMONIA in the last 168 hours. CBC:  Recent Labs Lab 05/28/15 1547 05/29/15 0416 05/31/15 0410  WBC 19.7* 20.0* 6.8  NEUTROABS 16.9*  --   --   HGB 11.5* 10.8* 10.7*  HCT 34.3* 32.8* 32.1*  MCV 95.5  95.1 96.4  PLT 175 149* 136*   Cardiac Enzymes: No results for input(s): CKTOTAL, CKMB, CKMBINDEX, TROPONINI in the last 168 hours. BNP (last 3 results) No results for input(s): BNP in the last 8760 hours.  ProBNP (last 3 results) No results for input(s): PROBNP in the last 8760 hours.  CBG: No results for input(s): GLUCAP in the last 168 hours.  Micro Recent Results (from the past 240 hour(s))  Culture, blood (routine x 2)     Status: None (Preliminary result)   Collection Time: 05/28/15  3:47 PM  Result Value Ref Range Status   Specimen Description BLOOD BLOOD LEFT ARM  Final   Special Requests BOTTLES DRAWN AEROBIC AND ANAEROBIC 5CC  Final   Culture   Final    NO GROWTH 2 DAYS Performed at [facility]    Report Status PENDING  Incomplete  Urine culture     Status: None   Collection Time: 05/28/15  3:47 PM  Result Value Ref Range Status   Specimen Description URINE, CLEAN CATCH  Final   Special Requests NONE  Final   Culture   Final    >=100,000 COLONIES/mL ESCHERICHIA COLI Performed at [facility]    Report Status 05/30/2015 FINAL  Final   Organism ID, Bacteria ESCHERICHIA COLI  Final      Susceptibility   Escherichia coli - MIC*    AMPICILLIN <=2 SENSITIVE Sensitive     CEFAZOLIN <=4 SENSITIVE Sensitive     CEFTRIAXONE <=1 SENSITIVE Sensitive     CIPROFLOXACIN <=0.25 SENSITIVE Sensitive     GENTAMICIN <=1 SENSITIVE Sensitive     IMIPENEM <=0.25 SENSITIVE Sensitive     NITROFURANTOIN <=16 SENSITIVE Sensitive     TRIMETH/SULFA <=20 SENSITIVE Sensitive     AMPICILLIN/SULBACTAM <=2 SENSITIVE Sensitive     PIP/TAZO <=4 SENSITIVE Sensitive     * >=100,000 COLONIES/mL ESCHERICHIA COLI  Culture, blood (routine x 2)     Status: None (Preliminary result)   Collection Time: 05/28/15  9:17 PM  Result Value Ref Range Status   Specimen Description BLOOD BLOOD RIGHT ARM  Final   Special Requests IN PEDIATRIC BOTTLE 3CC  Final   Culture   Final    NO GROWTH  1 DAY Performed at [facility]    Report Status PENDING  Incomplete     Studies: No results found.  Scheduled Meds: . antiseptic oral rinse  7 mL Mouth Rinse BID  . carbidopa-levodopa  2 tablet Oral TID  . diphenhydrAMINE  25 mg Oral Once  . docusate sodium  50 mg Oral BID  . enoxaparin (LOVENOX) injection  30 mg Subcutaneous Q24H  . imipenem-cilastatin  250 mg Intravenous Q8H  . psyllium  1 packet Oral Daily  . [START ON 06/01/2015] vancomycin  750 mg Intravenous Q24H   Continuous Infusions: . sodium chloride 75 mL/hr at 05/30/15 2124       Time spent: 35 minutes    [facility]  [facility] Pager [phone] If 7PM-7AM, please contact night-coverage at [email or web address], password [facility] 05/31/2015, 2:09 PM  LOS: 3 days

## 2015-05-31 NOTE — Care Management Important Message (Signed)
Important Message  Patient Details  Name: Richard Mcgee MRN: [id number] Date of Birth: 25-Dec-1927   Medicare Important Message Given:  Yes    [name] 05/31/2015, 11:12 AMImportant Message  Patient Details  Name: Richard Mcgee MRN: [id number] Date of Birth: June 24, 1928   Medicare Important Message Given:  Yes    [name] 05/31/2015, 11:12 AM

## 2015-05-31 NOTE — Progress Notes (Signed)
ANTIBIOTIC CONSULT NOTE - INITIAL  Pharmacy Consult for Vancomycin & Primaxin Indication: UTI, inadequate response with Ceftriaxone  Allergies  Allergen Reactions  . Sulfa Antibiotics     Patient's son states that he is not aware that patient has this allergy. No known allergic events.   Patient Measurements: Height: 5\' 6"  (167.6 cm) Weight: 143 lb 1.3 oz (64.9 kg) IBW/kg (Calculated) : 63.8   Vital Signs: Temp: 101.7 F (38.7 C) (11/29 0654) Temp Source: Oral (11/29 0654) BP: 128/70 mmHg (11/29 0654) Pulse Rate: 88 (11/29 0654) Intake/Output from previous day: 11/28 0701 - 11/29 0700 In: 2606.3 [P.O.:720; I.V.:1886.3] Out: -  Intake/Output from this shift:    Labs:  Recent Labs  05/28/15 1547 05/29/15 0416 05/31/15 0410  WBC 19.7* 20.0* 6.8  HGB 11.5* 10.8* 10.7*  PLT 175 149* 136*  CREATININE 2.13* 2.12* 2.10*   Estimated Creatinine Clearance: 23.2 mL/min (by C-G formula based on Cr of 2.1). No results for input(s): VANCOTROUGH, VANCOPEAK, VANCORANDOM, GENTTROUGH, GENTPEAK, GENTRANDOM, TOBRATROUGH, TOBRAPEAK, TOBRARND, AMIKACINPEAK, AMIKACINTROU, AMIKACIN in the last 72 hours.   Microbiology: Recent Results (from the past 720 hour(s))  Culture, blood (routine x 2)     Status: None (Preliminary result)   Collection Time: 05/28/15  3:47 PM  Result Value Ref Range Status   Specimen Description BLOOD BLOOD LEFT ARM  Final   Special Requests BOTTLES DRAWN AEROBIC AND ANAEROBIC 5CC  Final   Culture   Final    NO GROWTH 2 DAYS Performed at [facility]    Report Status PENDING  Incomplete  Urine culture     Status: None   Collection Time: 05/28/15  3:47 PM  Result Value Ref Range Status   Specimen Description URINE, CLEAN CATCH  Final   Special Requests NONE  Final   Culture   Final    >=100,000 COLONIES/mL ESCHERICHIA COLI Performed at [facility]    Report Status 05/30/2015 FINAL  Final   Organism ID, Bacteria ESCHERICHIA COLI  Final   Susceptibility   Escherichia coli - MIC*    AMPICILLIN <=2 SENSITIVE Sensitive     CEFAZOLIN <=4 SENSITIVE Sensitive     CEFTRIAXONE <=1 SENSITIVE Sensitive     CIPROFLOXACIN <=0.25 SENSITIVE Sensitive     GENTAMICIN <=1 SENSITIVE Sensitive     IMIPENEM <=0.25 SENSITIVE Sensitive     NITROFURANTOIN <=16 SENSITIVE Sensitive     TRIMETH/SULFA <=20 SENSITIVE Sensitive     AMPICILLIN/SULBACTAM <=2 SENSITIVE Sensitive     PIP/TAZO <=4 SENSITIVE Sensitive     * >=100,000 COLONIES/mL ESCHERICHIA COLI  Culture, blood (routine x 2)     Status: None (Preliminary result)   Collection Time: 05/28/15  9:17 PM  Result Value Ref Range Status   Specimen Description BLOOD BLOOD RIGHT ARM  Final   Special Requests IN PEDIATRIC BOTTLE 3CC  Final   Culture   Final    NO GROWTH 1 DAY Performed at [facility]    Report Status PENDING  Incomplete   Medical History: Past Medical History  Diagnosis Date  . Hypertension   . GERD (gastroesophageal reflux disease)   . Parkinsonism ([place])   . Renal cell cancer ([place]) 2010  . Degenerative disc disease, cervical   . Degenerative disc disease, lumbar   . Osteoarthritis   . Depression   . H/O rotator cuff tear   . BPH (benign prostatic hyperplasia)   . Renal insufficiency   . Lung nodules    Medications:  Scheduled:  .  antiseptic oral rinse  7 mL Mouth Rinse BID  . carbidopa-levodopa  2 tablet Oral TID  . diphenhydrAMINE  25 mg Oral Once  . docusate sodium  50 mg Oral BID  . enoxaparin (LOVENOX) injection  30 mg Subcutaneous Q24H  . imipenem-cilastatin  250 mg Intravenous Q8H  . psyllium  1 packet Oral Daily  . vancomycin  1,250 mg Intravenous Once   Anti-infectives    Start     Dose/Rate Route Frequency Ordered Stop   05/31/15 1000  imipenem-cilastatin (PRIMAXIN) 250 mg in sodium chloride 0.9 % 100 mL IVPB     250 mg 200 mL/hr over 30 Minutes Intravenous Every 8 hours 05/31/15 0818     05/31/15 0900  vancomycin (VANCOCIN) 1,250 mg in  sodium chloride 0.9 % 250 mL IVPB     1,250 mg 166.7 mL/hr over 90 Minutes Intravenous  Once 05/31/15 0816     05/30/15 1400  cefTRIAXone (ROCEPHIN) 1 g in dextrose 5 % 50 mL IVPB  Status:  Discontinued     1 g 100 mL/hr over 30 Minutes Intravenous Every 24 hours 05/30/15 1054 05/31/15 0759   05/28/15 1800  cefTRIAXone (ROCEPHIN) 1 g in dextrose 5 % 50 mL IVPB  Status:  Discontinued     1 g 100 mL/hr over 30 Minutes Intravenous Every 24 hours 05/28/15 1740 05/30/15 1048   05/28/15 1745  cefTRIAXone (ROCEPHIN) 2 g in dextrose 5 % 50 mL IVPB  Status:  Discontinued     2 g 100 mL/hr over 30 Minutes Intravenous  Once 05/28/15 1737 05/28/15 1740     Assessment: 87 yoM admit 11/26 with fever, urinary frequency, elevated SCr. Urine growing E coli which is pansensitive (including Rocephin), blood cultures ngtd. Completed 3 days of Rocephin, remains febrile, antibiotics changed empirically to Primaxin/Vancomycin.  Goal of Therapy:  Vancomycin trough level 15-20 mcg/ml  Plan:   Primaxin 250mg  q8hr  Vancomycin 1250mg  x1, followed by 750mg  q24  Follow cultures, temp curve  Narrow abx soon with improvement, negative blood cx  Minda Ditto PharmD Pager [phone] 05/31/2015, 10:52 AM

## 2015-05-31 NOTE — Consult Note (Signed)
[facility]    Date of Admission:  05/28/2015  Date of Consult:  05/31/2015  Reason for Consult: FUO Referring Physician: Dr. Hartford Poli   HPI: Richard Mcgee is an 81 y.o. male with severe Parkinsonism with dementia, aortic stenosis, hx of renal cell cancer sp nephrectomy but w metastatic disease also with THA surgery, cervical spine surgery who was admitted on 05/28/15 with fever up to 101.4, increasing urinary frequency, pt reporting dysuria and having flank tenderness to palpation by his son who is with [facility]. He was having worsening time ambulating (note he did fall several weeks ago but not this admission). In the ED his labs were signficant for elevated WBC, worsened renal fxn, lactic acid slightly up and UA with pyuria. He had urine culture sent and was started on rocephin. Urine grew >100K of E coli that was S to all abx tested. Despite several days of antibiotics he has had persistent fever and his confusion has not cleared.  We were called to assist in workup of FUO. Note he had CT abdomen earlier this month which revealed in size of pulmonary nodules thought to be due to his renal cell ca and new lesion in the adrenal gland. Dr. Jana Hakim saw the patient and reviewed rate of progression of pts renal cell mets felt that their growth rate was reassuringly slow. He was found to have compression fractures at t12 that were not new per family.      Past Medical History  Diagnosis Date  . Hypertension   . GERD (gastroesophageal reflux disease)   . Parkinsonism ([place])   . Renal cell cancer ([place]) 2010  . Degenerative disc disease, cervical   . Degenerative disc disease, lumbar   . Osteoarthritis   . Depression   . H/O rotator cuff tear   . BPH (benign prostatic hyperplasia)   . Renal insufficiency   . Lung nodules     Past Surgical History  Procedure Laterality Date  . Nephrectomy Right   . Total hip arthroplasty Bilateral     . Cervical laminectomy    . Cataract extraction, bilateral  2015    Social History:  reports that he has quit smoking. He has never used smokeless tobacco. He reports that he does not drink alcohol or use illicit drugs.   Family History  Problem Relation Age of Onset  . CAD    . Hypertension    . CVA    . Diabetes    . Kidney cancer    . Depression      Allergies  Allergen Reactions  . Sulfa Antibiotics     Patient's son states that he is not aware that patient has this allergy. No known allergic events.     Medications: I have reviewed patients current medications as documented in Epic Anti-infectives    Start     Dose/Rate Route Frequency Ordered Stop   06/01/15 1000  vancomycin (VANCOCIN) IVPB 750 mg/150 ml premix  Status:  Discontinued     750 mg 150 mL/hr over 60 Minutes Intravenous Every 24 hours 05/31/15 1054 05/31/15 1553   05/31/15 1700  cefTRIAXone (ROCEPHIN) 1 g in dextrose 5 % 50 mL IVPB     1 g 100 mL/hr over 30 Minutes Intravenous Every 24 hours 05/31/15 1553     05/31/15 1000  imipenem-cilastatin (PRIMAXIN) 250 mg in sodium chloride 0.9 % 100 mL IVPB  Status:  Discontinued  250 mg 200 mL/hr over 30 Minutes Intravenous Every 8 hours 05/31/15 0818 05/31/15 1553   05/31/15 0900  vancomycin (VANCOCIN) 1,250 mg in sodium chloride 0.9 % 250 mL IVPB     1,250 mg 166.7 mL/hr over 90 Minutes Intravenous  Once 05/31/15 0816 05/31/15 1107   05/30/15 1400  cefTRIAXone (ROCEPHIN) 1 g in dextrose 5 % 50 mL IVPB  Status:  Discontinued     1 g 100 mL/hr over 30 Minutes Intravenous Every 24 hours 05/30/15 1054 05/31/15 0759   05/28/15 1800  cefTRIAXone (ROCEPHIN) 1 g in dextrose 5 % 50 mL IVPB  Status:  Discontinued     1 g 100 mL/hr over 30 Minutes Intravenous Every 24 hours 05/28/15 1740 05/30/15 1048   05/28/15 1745  cefTRIAXone (ROCEPHIN) 2 g in dextrose 5 % 50 mL IVPB  Status:  Discontinued     2 g 100 mL/hr over 30 Minutes Intravenous  Once 05/28/15 1737  05/28/15 1740         ROS: not obtainable due to patients delirium   Blood pressure 148/88, pulse 86, temperature 98 F (36.7 C), temperature source Oral, resp. rate 20, height $RemoveBe'5\' 6"'MKMrCeAeV$  (1.676 m), weight 143 lb 1.3 oz (64.9 kg), SpO2 96 %. General: Alert and trying to answer questions but delirious and also difficult to understand. HEENT: anicteric sclera,  EOMI, oropharynx clear and without exudate Cardiovascular: regular rate, normal r, 2/VI murmur LUSB and PMI Pulmonary: few expiratory wheezes  Gastrointestinal: soft nontender, nondistended, normal bowel sounds, Musculoskeletal: no  clubbing or edema noted bilaterally Skin, soft tissue: no rashes Neuro: tremors from PD   Results for orders placed or performed during the hospital encounter of 05/28/15 (from the past 48 hour(s))  Basic metabolic panel     Status: Abnormal   Collection Time: 05/31/15  4:10 AM  Result Value Ref Range   Sodium 135 135 - 145 mmol/L   Potassium 4.7 3.5 - 5.1 mmol/L   Chloride 107 101 - 111 mmol/L   CO2 22 22 - 32 mmol/L   Glucose, Bld 109 (H) 65 - 99 mg/dL   BUN 48 (H) 6 - 20 mg/dL   Creatinine, Ser 2.10 (H) 0.61 - 1.24 mg/dL   Calcium 8.3 (L) 8.9 - 10.3 mg/dL   GFR calc non Af Amer 27 (L) >60 mL/min   GFR calc Af Amer 31 (L) >60 mL/min    Comment: (NOTE) The eGFR has been calculated using the CKD EPI equation. This calculation has not been validated in all clinical situations. eGFR's persistently <60 mL/min signify possible Chronic Kidney Disease.    Anion gap 6 5 - 15  CBC     Status: Abnormal   Collection Time: 05/31/15  4:10 AM  Result Value Ref Range   WBC 6.8 4.0 - 10.5 K/uL   RBC 3.33 (L) 4.22 - 5.81 MIL/uL   Hemoglobin 10.7 (L) 13.0 - 17.0 g/dL   HCT 32.1 (L) 39.0 - 52.0 %   MCV 96.4 78.0 - 100.0 fL   MCH 32.1 26.0 - 34.0 pg   MCHC 33.3 30.0 - 36.0 g/dL   RDW 14.1 11.5 - 15.5 %   Platelets 136 (L) 150 - 400 K/uL    Recent Results (from the past 720 hour(s))  Culture, blood  (routine x 2)     Status: None (Preliminary result)   Collection Time: 05/28/15  3:47 PM  Result Value Ref Range Status   Specimen Description BLOOD BLOOD LEFT ARM  Final  Special Requests BOTTLES DRAWN AEROBIC AND ANAEROBIC 5CC  Final   Culture   Final    NO GROWTH 3 DAYS Performed at [facility]    Report Status PENDING  Incomplete  Urine culture     Status: None   Collection Time: 05/28/15  3:47 PM  Result Value Ref Range Status   Specimen Description URINE, CLEAN CATCH  Final   Special Requests NONE  Final   Culture   Final    >=100,000 COLONIES/mL ESCHERICHIA COLI Performed at [facility]    Report Status 05/30/2015 FINAL  Final   Organism ID, Bacteria ESCHERICHIA COLI  Final      Susceptibility   Escherichia coli - MIC*    AMPICILLIN <=2 SENSITIVE Sensitive     CEFAZOLIN <=4 SENSITIVE Sensitive     CEFTRIAXONE <=1 SENSITIVE Sensitive     CIPROFLOXACIN <=0.25 SENSITIVE Sensitive     GENTAMICIN <=1 SENSITIVE Sensitive     IMIPENEM <=0.25 SENSITIVE Sensitive     NITROFURANTOIN <=16 SENSITIVE Sensitive     TRIMETH/SULFA <=20 SENSITIVE Sensitive     AMPICILLIN/SULBACTAM <=2 SENSITIVE Sensitive     PIP/TAZO <=4 SENSITIVE Sensitive     * >=100,000 COLONIES/mL ESCHERICHIA COLI  Culture, blood (routine x 2)     Status: None (Preliminary result)   Collection Time: 05/28/15  9:17 PM  Result Value Ref Range Status   Specimen Description BLOOD BLOOD RIGHT ARM  Final   Special Requests IN PEDIATRIC BOTTLE 3CC  Final   Culture   Final    NO GROWTH 2 DAYS Performed at [facility]    Report Status PENDING  Incomplete     Impression/Recommendation  Principal Problem:   Sepsis ([place]) Active Problems:   Renal cell carcinoma of right kidney metastatic to other site [facility])   Parkinson's disease (tremor, stiffness, slow motion, unstable posture) (HCC)   CKD (chronic kidney disease), stage III   UTI (lower urinary tract infection)   Acute encephalopathy    Chronic diastolic CHF (congestive heart failure), NYHA class 1 (HCC)   Daymeon Fischman is a 81 y.o. male with  Renal cell carcinoma sp nephrectomy, with recurrence with lung nodules progressing (albeit slowly) adrenal gland lesion newlly discovered admitted with fevers, septic picture and thought to have UTI but still febrile  #1 FUO:  I will check Dopplers of LE and UE bilaterally If no DVT and still fevering tomorrow will order CT chest abd/pelvis with oral contrast only I will check a few basic FUO labs  Renal cell ca can itself cause fevers though he has not had fevers until this past week that family knows of  Drug fever possible but not terribly likely  #2 UTI: change back to rocephin for now. If still fevring and CT abdomen is clear will stop the rocephin after 7 days max  #2 Goals of care: Daughter who is NP informed me that if the patient cannot recover sufficiently to move back home they would want him to go to SNF with hospice if I understood her correctly. Palliative care should be involved to help weigh decisions here. I am afraid that with this admission his Neurocognitive function may well take another "hit."     05/31/2015, 4:33 PM   Thank you so much for this interesting consult  [facility] for [place] [phone] (pager) [phone] (office) 05/31/2015, 4:33 PM  Rhina Brack[place] 05/31/2015, 4:33 PM

## 2015-05-31 NOTE — Clinical Social Work Placement (Signed)
   CLINICAL SOCIAL WORK PLACEMENT  NOTE  Date:  05/31/2015  Patient Details  Name: Richard Mcgee MRN: [id number] Date of Birth: [birth year]  Clinical Social Work is seeking post-discharge placement for this patient at the [place] level of care (*CSW will initial, date and re-position this form in  chart as items are completed):  Yes   Patient/family provided with [facility] Work Department's list of facilities offering this level of care within the geographic area requested by the patient (or if unable, by the patient's family).  Yes   Patient/family informed of their freedom to choose among providers that offer the needed level of care, that participate in Medicare, Medicaid or managed care program needed by the patient, have an available bed and are willing to accept the patient.  Yes   Patient/family informed of [facility]'s ownership interest in [facility] and [facility], as well as of the fact that they are under no obligation to receive care at these facilities.  PASRR submitted to EDS on 05/30/15     PASRR number received on       Existing PASRR number confirmed on       FL2 transmitted to all facilities in geographic area requested by pt/family on 05/30/15     FL2 transmitted to all facilities within larger geographic area on       Patient informed that his/her managed care company has contracts with or will negotiate with certain facilities, including the following:        Yes   Patient/family informed of bed offers received.  Patient [facility] bed [facility]     Physician recommends and patient chooses bed at      Patient to be transferred to [facility] on  .  Patient to be transferred to facility by       Patient family notified on   of transfer.  Name of family member notified:        PHYSICIAN Please sign FL2, Please sign DNR     Additional Comment:    _______________________________________________ [name], LCSW 05/31/2015, 6:29 PM

## 2015-05-31 NOTE — Progress Notes (Signed)
CSW continuing to follow.   CSW received phone call from pt daughter-in-law this morning. Pt daughter-in-law expressed that she is concerned about pt being able to participate and progress in rehab at [facility] and wondered if he had to get PT once at facility. CSW explained that in order for Medicare to cover SNF then the facility has to attempt to work with pt with PT and then if pt does not progress insurance will not continue to cover and the facility placement would become private pay. Pt daughter-in-law inquired about pt going to facility under long term care policy. CSW explained that pt family would need to discuss with facility as most facilities will attempt to utilize the pt Medicare prior to transitioning to long term care policies. Pt daughter-in-law wanted to ensure that [facility] could accept pt and pt would be able to transition to long term care policy if needed. CSW notified pt daughter-in-law that [facility] offered a bed for pt and CSW will discuss with [facility] how long term care policies are handled at the facility.   CSW contacted [employer] and spoke with admissions coordinator and discussed that concerns of pt daughter-in-law. [place] discussed that facility would attempt to utilize pt Medicare, but if pt did not progress then pt would become private pay and facility could send information to long term care agency which would reimburse the family if appropriate, but the reimbursement is dependent on the pt long term care policy. CSW spoke with pt daughter-in-law at bedside and updated pt daughter-in-law with this information. Pt daughter-in-law expressed appreciation.   Per MD, pt not yet medically ready for discharge today.  CSW updated [facility] that pt not yet ready.  CSW to continue to follow to provide support and assist with pt disposition needs.   Alison Murray, MSW, [place] [facility] [phone]

## 2015-05-31 NOTE — NC FL2 (Signed)
[place] LEVEL OF CARE SCREENING TOOL     IDENTIFICATION  Patient Name: Richard Mcgee Birthdate: 05-16-1928 Sex: male Admission Date (Current Location): 05/28/2015  [facility] and [place] Number: Herbalist and Address:  [facility],  [place] [address] [place]      Provider Number: [id number]  Attending Physician Name and Address:  Verlee Monte, MD  Relative Name and Phone Number:       Current Level of Care: Hospital Recommended Level of Care: [place] Prior Approval Number:    Date Approved/Denied:   PASRR Number: [id number]  Discharge Plan: SNF    Current Diagnoses: Patient Active Problem List   Diagnosis Date Noted  . UTI (lower urinary tract infection) 05/28/2015  . Acute encephalopathy 05/28/2015  . Chronic diastolic CHF (congestive heart failure), NYHA class 1 ([place]) 05/28/2015  . Sepsis ([place]) 05/28/2015  . 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 Visual hallucinations 11/12/2014  . Renal cell carcinoma of right kidney metastatic to other site ([place]) 11/11/2014  . Parkinson's disease (tremor, stiffness, slow motion, unstable posture) ([place]) 11/11/2014  . CKD (chronic kidney disease), stage III 11/11/2014    Orientation ACTIVITIES/SOCIAL BLADDER RESPIRATION    Self    Incontinent (incontinent at times) Normal  BEHAVIORAL SYMPTOMS/MOOD NEUROLOGICAL BOWEL NUTRITION STATUS      Continent Diet (Diet Heart)  PHYSICIAN VISITS COMMUNICATION OF NEEDS Height & Weight Skin    Verbally 5\' 6"  (167.6 cm) 143 lbs. Normal          AMBULATORY STATUS RESPIRATION    Assist extensive Normal      Personal Care Assistance Level of Assistance  Bathing, Dressing, Feeding Bathing Assistance: Maximum assistance Feeding assistance: Independent Dressing Assistance: Maximum assistance      Functional Limitations Info  Sight, Hearing, Speech Sight Info: Impaired Hearing Info: Adequate Speech Info:  Adequate       SPECIAL CARE FACTORS FREQUENCY  PT (By licensed PT), OT (By licensed OT)     PT Frequency: 5 x a week OT Frequency:  (5 x a week)           Additional Factors Info  Code Status, Allergies Code Status Info: DNR code status Allergies Info: Sulfa Antibiotics           Current Medications (05/31/2015):  This is the current hospital active medication list Current Facility-Administered Medications  Medication Dose Route Frequency Provider Last Rate Last Dose  . 0.9 %  sodium chloride infusion   Intravenous Continuous Verlee Monte, MD 75 mL/hr at 05/30/15 2124    . acetaminophen (TYLENOL) tablet 650 mg  650 mg Oral Q6H PRN Toy Baker, MD   650 mg at 05/31/15 0702   Or  . acetaminophen (TYLENOL) suppository 650 mg  650 mg Rectal Q6H PRN Toy Baker, MD      . antiseptic oral rinse (CPC / CETYLPYRIDINIUM CHLORIDE 0.05%) solution 7 mL  7 mL Mouth Rinse BID Verlee Monte, MD   7 mL at 05/31/15 1021  . carbidopa-levodopa (SINEMET IR) 25-100 MG per tablet immediate release 2 tablet  2 tablet Oral TID Toy Baker, MD   2 tablet at 05/31/15 1604  . cefTRIAXone (ROCEPHIN) 1 g in dextrose 5 % 50 mL IVPB  1 g Intravenous Q24H Truman Hayward, MD      . diphenhydrAMINE (BENADRYL) capsule 25 mg  25 mg Oral Once Jeryl Columbia, NP   Stopped at 05/30/15 2130  . docusate  sodium (COLACE) capsule 50 mg  50 mg Oral BID Toy Baker, MD   50 mg at 05/31/15 1021  . enoxaparin (LOVENOX) injection 30 mg  30 mg Subcutaneous Q24H Toy Baker, MD   30 mg at 05/30/15 2132  . HYDROcodone-acetaminophen (NORCO/VICODIN) 5-325 MG per tablet 1-2 tablet  1-2 tablet Oral Q4H PRN Toy Baker, MD      . ondansetron (ZOFRAN) tablet 4 mg  4 mg Oral Q6H PRN Toy Baker, MD       Or  . ondansetron (ZOFRAN) injection 4 mg  4 mg Intravenous Q6H PRN Toy Baker, MD      . polyethylene glycol (MIRALAX / GLYCOLAX) packet 17 g  17 g Oral Daily PRN  Toy Baker, MD   17 g at 05/30/15 0715  . psyllium (HYDROCIL/METAMUCIL) packet 1 packet  1 packet Oral Daily Verlee Monte, MD   1 packet at 05/31/15 1021     Discharge Medications: Please see discharge summary for a list of discharge medications.  Relevant Imaging Results:  Relevant Lab Results:  Recent Labs    Additional Information SSN: [id number]   KIDD, Hughes Better A, LCSW

## 2015-05-31 NOTE — Progress Notes (Signed)
Paged MD about fever this am.

## 2015-06-01 ENCOUNTER — Inpatient Hospital Stay (HOSPITAL_COMMUNITY): Payer: Medicare Other

## 2015-06-01 DIAGNOSIS — I5021 Acute systolic (congestive) heart failure: Secondary | ICD-10-CM

## 2015-06-01 DIAGNOSIS — R410 Disorientation, unspecified: Secondary | ICD-10-CM

## 2015-06-01 DIAGNOSIS — R509 Fever, unspecified: Secondary | ICD-10-CM

## 2015-06-01 DIAGNOSIS — N179 Acute kidney failure, unspecified: Secondary | ICD-10-CM

## 2015-06-01 LAB — BASIC METABOLIC PANEL
Anion gap: 10 (ref 5–15)
BUN: 48 mg/dL — AB (ref 6–20)
CO2: 19 mmol/L — ABNORMAL LOW (ref 22–32)
CREATININE: 2.25 mg/dL — AB (ref 0.61–1.24)
Calcium: 8.4 mg/dL — ABNORMAL LOW (ref 8.9–10.3)
Chloride: 106 mmol/L (ref 101–111)
GFR calc Af Amer: 29 mL/min — ABNORMAL LOW (ref >60)
GFR, EST NON AFRICAN AMERICAN: 25 mL/min — AB (ref >60)
GLUCOSE: 159 mg/dL — AB (ref 65–99)
POTASSIUM: 4.1 mmol/L (ref 3.5–5.1)
Sodium: 135 mmol/L (ref 135–145)

## 2015-06-01 LAB — CBC
HEMATOCRIT: 30.5 % — AB (ref 39.0–52.0)
HEMOGLOBIN: 10 g/dL — AB (ref 13.0–17.0)
MCH: 30.8 pg (ref 26.0–34.0)
MCHC: 32.8 g/dL (ref 30.0–36.0)
MCV: 93.8 fL (ref 78.0–100.0)
PLATELETS: 142 10*3/uL — AB (ref 150–400)
RBC: 3.25 MIL/uL — AB (ref 4.22–5.81)
RDW: 13.8 % (ref 11.5–15.5)
WBC: 10.9 10*3/uL — ABNORMAL HIGH (ref 4.0–10.5)

## 2015-06-01 LAB — URINE CULTURE: Culture: 1000

## 2015-06-01 MED ORDER — HYDRALAZINE HCL 10 MG PO TABS
10.0000 mg | ORAL_TABLET | Freq: Three times a day (TID) | ORAL | Status: DC
  Filled 2015-06-01: qty 1

## 2015-06-01 MED ORDER — HYDRALAZINE HCL 20 MG/ML IJ SOLN
5.0000 mg | Freq: Four times a day (QID) | INTRAMUSCULAR | Status: DC | PRN
  Administered 2015-06-01: 5 mg via INTRAVENOUS
  Filled 2015-06-01: qty 1

## 2015-06-01 MED ORDER — HYDRALAZINE HCL 20 MG/ML IJ SOLN: 5.0000 mg | INTRAMUSCULAR | Status: DC | PRN

## 2015-06-01 MED ORDER — HEPARIN SODIUM (PORCINE) 5000 UNIT/ML IJ SOLN
5000.0000 [IU] | Freq: Three times a day (TID) | INTRAMUSCULAR | Status: DC
  Administered 2015-06-01 – 2015-06-02 (×2): 5000 [IU] via SUBCUTANEOUS
  Filled 2015-06-01 (×2): qty 1

## 2015-06-01 MED ORDER — FUROSEMIDE 10 MG/ML IJ SOLN
20.0000 mg | Freq: Every day | INTRAMUSCULAR | Status: DC
  Administered 2015-06-01 – 2015-06-02 (×2): 20 mg via INTRAVENOUS
  Filled 2015-06-01 (×2): qty 2

## 2015-06-01 MED ORDER — FUROSEMIDE 10 MG/ML IJ SOLN: 20.0000 mg | Freq: Every day | INTRAMUSCULAR | Status: DC

## 2015-06-01 MED ORDER — CARVEDILOL 3.125 MG PO TABS: 3.1250 mg | ORAL_TABLET | Freq: Two times a day (BID) | ORAL | Status: DC

## 2015-06-01 MED ORDER — FUROSEMIDE 10 MG/ML IJ SOLN: 40.0000 mg | Freq: Two times a day (BID) | INTRAMUSCULAR | Status: DC

## 2015-06-01 NOTE — Progress Notes (Signed)
[facility] for Infectious Disease    Subjective: No new complaints   Antibiotics:  Anti-infectives    Start     Dose/Rate Route Frequency Ordered Stop   06/01/15 1000  vancomycin (VANCOCIN) IVPB 750 mg/150 ml premix  Status:  Discontinued     750 mg 150 mL/hr over 60 Minutes Intravenous Every 24 hours 05/31/15 1054 05/31/15 1553   05/31/15 1700  cefTRIAXone (ROCEPHIN) 1 g in dextrose 5 % 50 mL IVPB     1 g 100 mL/hr over 30 Minutes Intravenous Every 24 hours 05/31/15 1553     05/31/15 1000  imipenem-cilastatin (PRIMAXIN) 250 mg in sodium chloride 0.9 % 100 mL IVPB  Status:  Discontinued     250 mg 200 mL/hr over 30 Minutes Intravenous Every 8 hours 05/31/15 0818 05/31/15 1553   05/31/15 0900  vancomycin (VANCOCIN) 1,250 mg in sodium chloride 0.9 % 250 mL IVPB     1,250 mg 166.7 mL/hr over 90 Minutes Intravenous  Once 05/31/15 0816 05/31/15 1107   05/30/15 1400  cefTRIAXone (ROCEPHIN) 1 g in dextrose 5 % 50 mL IVPB  Status:  Discontinued     1 g 100 mL/hr over 30 Minutes Intravenous Every 24 hours 05/30/15 1054 05/31/15 0759   05/28/15 1800  cefTRIAXone (ROCEPHIN) 1 g in dextrose 5 % 50 mL IVPB  Status:  Discontinued     1 g 100 mL/hr over 30 Minutes Intravenous Every 24 hours 05/28/15 1740 05/30/15 1048   05/28/15 1745  cefTRIAXone (ROCEPHIN) 2 g in dextrose 5 % 50 mL IVPB  Status:  Discontinued     2 g 100 mL/hr over 30 Minutes Intravenous  Once 05/28/15 1737 05/28/15 1740      Medications: Scheduled Meds: . antiseptic oral rinse  7 mL Mouth Rinse BID  . carbidopa-levodopa  2 tablet Oral TID  . cefTRIAXone (ROCEPHIN)  IV  1 g Intravenous Q24H  . diphenhydrAMINE  25 mg Oral Once  . docusate sodium  50 mg Oral BID  . [START ON 06/02/2015] furosemide  20 mg Intravenous Daily  . furosemide  40 mg Intravenous Once  . heparin subcutaneous  5,000 Units Subcutaneous 3 times per day  . psyllium  1 packet Oral Daily   Continuous Infusions: . sodium  chloride 10 mL/hr at 06/01/15 0100   PRN Meds:.acetaminophen **OR** acetaminophen, HYDROcodone-acetaminophen, ondansetron **OR** ondansetron (ZOFRAN) IV, polyethylene glycol    Objective: Weight change:   Intake/Output Summary (Last 24 hours) at 06/01/15 1809 Last data filed at 06/01/15 1350  Gross per 24 hour  Intake    600 ml  Output   1150 ml  Net   -550 ml   Blood pressure 160/90, pulse 93, temperature 98.5 F (36.9 C), temperature source Oral, resp. rate 20, height 5\' 6"  (1.676 m), weight 143 lb 1.3 oz (64.9 kg), SpO2 93 %. Temp:  [98.5 F (36.9 C)-98.7 F (37.1 C)] 98.5 F (36.9 C) (11/30 1343) Pulse Rate:  [79-116] 93 (11/30 1612) Resp:  [20-24] 20 (11/30 1343) BP: (137-160)/(62-90) 160/90 mmHg (11/30 1612) SpO2:  [92 %-95 %] 93 % (11/30 1612)  Physical Exam: General: tremulous  HEENT: anicteric sclera, EOMI,  Cardiovascular: regular rate, normal r, 2/VI murmur LUSB and PMI Pulmonary: few expiratory wheezes  Gastrointestinal: soft nontender, nondistended, normal bowel sounds, Musculoskeletal: no clubbing or edema noted bilaterally Skin, soft tissue: no rashes Neuro: tremors from PD  CBC:  CBC Latest Ref Rng  06/01/2015 05/31/2015 05/29/2015  WBC 4.0 - 10.5 K/uL 10.9(H) 6.8 20.0(H)  Hemoglobin 13.0 - 17.0 g/dL 10.0(L) 10.7(L) 10.8(L)  Hematocrit 39.0 - 52.0 % 30.5(L) 32.1(L) 32.8(L)  Platelets 150 - 400 K/uL 142(L) 136(L) 149(L)       BMET  Recent Labs  05/31/15 0410 06/01/15 0445  NA 135 135  K 4.7 4.1  CL 107 106  CO2 22 19*  GLUCOSE 109* 159*  BUN 48* 48*  CREATININE 2.10* 2.25*  CALCIUM 8.3* 8.4*     Liver Panel  No results for input(s): PROT, ALBUMIN, AST, ALT, ALKPHOS, BILITOT, BILIDIR, IBILI in the last 72 hours.     Sedimentation Rate No results for input(s): ESRSEDRATE in the last 72 hours. C-Reactive Protein No results for input(s): CRP in the last 72 hours.  Micro Results: Recent Results (from the past 720 hour(s))    Culture, blood (routine x 2)     Status: None (Preliminary result)   Collection Time: 05/28/15  3:47 PM  Result Value Ref Range Status   Specimen Description BLOOD BLOOD LEFT ARM  Final   Special Requests BOTTLES DRAWN AEROBIC AND ANAEROBIC 5CC  Final   Culture   Final    NO GROWTH 4 DAYS Performed at [facility]    Report Status PENDING  Incomplete  Urine culture     Status: None   Collection Time: 05/28/15  3:47 PM  Result Value Ref Range Status   Specimen Description URINE, CLEAN CATCH  Final   Special Requests NONE  Final   Culture   Final    >=100,000 COLONIES/mL ESCHERICHIA COLI Performed at [facility]    Report Status 05/30/2015 FINAL  Final   Organism ID, Bacteria ESCHERICHIA COLI  Final      Susceptibility   Escherichia coli - MIC*    AMPICILLIN <=2 SENSITIVE Sensitive     CEFAZOLIN <=4 SENSITIVE Sensitive     CEFTRIAXONE <=1 SENSITIVE Sensitive     CIPROFLOXACIN <=0.25 SENSITIVE Sensitive     GENTAMICIN <=1 SENSITIVE Sensitive     IMIPENEM <=0.25 SENSITIVE Sensitive     NITROFURANTOIN <=16 SENSITIVE Sensitive     TRIMETH/SULFA <=20 SENSITIVE Sensitive     AMPICILLIN/SULBACTAM <=2 SENSITIVE Sensitive     PIP/TAZO <=4 SENSITIVE Sensitive     * >=100,000 COLONIES/mL ESCHERICHIA COLI  Culture, blood (routine x 2)     Status: None (Preliminary result)   Collection Time: 05/28/15  9:17 PM  Result Value Ref Range Status   Specimen Description BLOOD BLOOD RIGHT ARM  Final   Special Requests IN PEDIATRIC BOTTLE 3CC  Final   Culture   Final    NO GROWTH 3 DAYS Performed at [facility]    Report Status PENDING  Incomplete  Urine culture     Status: None   Collection Time: 05/31/15  4:17 PM  Result Value Ref Range Status   Specimen Description URINE, RANDOM  Final   Special Requests NONE  Final   Culture   Final    1,000 COLONIES/mL INSIGNIFICANT GROWTH Performed at [facility]    Report Status 06/01/2015 FINAL  Final     Studies/Results: Dg Chest Port 1 View  05/31/2015  CLINICAL DATA:  Short of breath for 1 day EXAM: PORTABLE CHEST 1 VIEW COMPARISON:  05/28/2015 FINDINGS: Moderate cardiomegaly. Diffuse interstitial edema. Central airspace edema. No pneumothorax. IMPRESSION: CHF with pulmonary edema. Electronically Signed   By: Marybelle Killings M.D.   On: 05/31/2015 21:23  Assessment/Plan:  INTERVAL HISTORY:  Pt fevers have defervesced in last day  Echo with no vegetations via TTE  Family did not wish for dopplers   Principal Problem:   Sepsis ([place]) Active Problems:   Renal cell carcinoma of right kidney metastatic to other site [facility])   Parkinson's disease (tremor, stiffness, slow motion, unstable posture) (HCC)   CKD (chronic kidney disease), stage III   UTI (lower urinary tract infection)   Acute encephalopathy   Chronic diastolic CHF (congestive heart failure), NYHA class 1 (HCC)   FUO (fever of unknown origin)   Goals of care, counseling/discussion    Richard Mcgee is a 81 y.o. male with Renal cell carcinoma sp nephrectomy, with recurrence with lung nodules progressing (albeit slowly) adrenal gland lesion newlly discovered admitted with fevers, septic picture and thought to have UTI but then with persistent fevers and confusions.  #1 FUO:  I was told by family member at the bedside today that daughter had other family had discussed with primary and they did not want dopplers or further aggressive workup for fevers.  I will therefore back away from the case for now  Renal cell ca can itself cause fevers though he has not had fevers until this past week that family knows of  Drug fever possible but not terribly likely  #2 UTI: continue rocephin for total of 7 days   I will sign off for now. Dr. Linus Salmons is covering consults at [facility] for December   LOS: 4 days   Alcide Evener 06/01/2015, 6:09 PM

## 2015-06-01 NOTE — Progress Notes (Signed)
  Echocardiogram 2D Echocardiogram has been performed.  Bobbye Charleston 06/01/2015, 9:56 AM

## 2015-06-01 NOTE — Progress Notes (Addendum)
[facility] PROGRESS NOTE  Richard Mcgee [id number] DOB: Apr 09, 1928 DOA: 05/28/2015 PCP: Mathews Argyle, MD  Brief narrative 81 year old male with history of hypertension, GERD, parkinsonism renal cell cancer with bilateral lung metastases (slowly progressive), degenerative disc disease, depression, BPH presented with sepsis (fever of 101.4 Fahrenheit, tachypnea ,, confusion with leukocytosis and acute kidney injury). UA was positive for UTI.  admitted to hospitalist service for sepsis and acute encephalopathy.   Assessment/Plan: Sepsis with acute encephalopathy Secondary to Escherichia coli UTI On empiric Rocephin which is pansensitive. However despite several days of antibiotics patient having temperature spikes. ID consult appreciated recommended to continue Rocephin. He remains afebrile past 24 hours.  Acute metabolic encephalopathy Appears to be secondary to sepsis. Remains afebrile past 24 hours and no longer drowsy but still quite confused. May take a few more days to return to baseline.  Acute systolic CHF Patient reportedly became short of breath on 11/29. Chest x-ray done showed findings of CHF with pulmonary edema. Is history of diastolic dysfunction. 2-D echo done today showing severely reduced systolic function with EF of 25-30% and grade 1 diastolic dysfunction. Shows moderate aortic stenosis and PA pressure 49 mmHg. Also shows trivial pericardial effusion.  Cannot use ACE inhibitor due to renal dysfunction. Patient diuresed well on low-dose Lasix. Will continue. Will hold off BB, or hydralazine to avoid hypotensive.   Acute on chronic kidney disease stage III Renal function will likely worsen further with IV Lasix. Monitor closely.  Parkinson's disease Continue Sinemet. Appears to have worsened with underlying encephalopathy.  Renal cell carcinoma of the right kidney with distant metastases Not on any treatment. Has underlying chronic kidney  disease  Goals of care Physical therapy recommends skilled nursing facility. Discussed with daughter-in-law at bedside who would like patient to get some rehabilitation and eventually come home. Daughter-in-law also looking for possibility of hospice but does not want to give up yet on home health services that patient would receive. Given his cardiomyopathy thinks may be difficult. Consulted palliative care to discuss goals of care.   Code Status: DO NOT RESUSCITATE Family Communication: Daughter-in-law at bedside Disposition Plan: Possibly skilled nursing facility on 12/2   Consultants:  None  Procedures:  None  Antibiotics:  IV Rocephin 11/26  HPI/Subjective: Seen and examined. Patient became short of breath last evening with chest x-ray showing acute pulmonary edema. Received a total of 60 mg IV Lasix. Remains afebrile past 24 hours.  Objective: Filed Vitals:   06/01/15 0602 06/01/15 1343  BP: 137/77 137/62  Pulse: 101 79  Temp: 98.7 F (37.1 C) 98.5 F (36.9 C)  Resp: 22 20    Intake/Output Summary (Last 24 hours) at 06/01/15 1532 Last data filed at 06/01/15 1350  Gross per 24 hour  Intake    600 ml  Output   1150 ml  Net   -550 ml   Filed Weights   05/28/15 1509 05/28/15 2048  Weight: 63.504 kg (140 lb) 64.9 kg (143 lb 1.3 oz)    Exam:   General:  Elderly male not in distress, confused  HEENT: No pallor, moist oral mucosa, no JVD  Chest: Bibasilar crackles, no rhonchi or wheeze  Cardiovascular: Normal S1 and S2, no murmurs rub or gallop  GI: Soft, nondistended, nontender, bowel sounds present  Musculoskeletal: Warm, trace edema  CNS: Alert and awake, oriented x1  Data Reviewed: Basic Metabolic Panel:  Recent Labs Lab 05/28/15 1547 05/29/15 0416 05/31/15 0410 06/01/15 0445  NA 136 130* 135 135  K 5.0  4.4 4.7 4.1  CL 106 101 107 106  CO2 23 21* 22 19*  GLUCOSE 131* 112* 109* 159*  BUN 43* 43* 48* 48*  CREATININE 2.13* 2.12* 2.10*  2.25*  CALCIUM 8.4* 8.1* 8.3* 8.4*  MG  --  1.8  --   --   PHOS  --  2.6  --   --    Liver Function Tests:  Recent Labs Lab 05/28/15 1547 05/29/15 0416  AST 17 13*  ALT <5* <5*  ALKPHOS 82 61  BILITOT 0.9 0.9  PROT 6.4* 5.8*  ALBUMIN 3.9 3.5   No results for input(s): LIPASE, AMYLASE in the last 168 hours. No results for input(s): AMMONIA in the last 168 hours. CBC:  Recent Labs Lab 05/28/15 1547 05/29/15 0416 05/31/15 0410 06/01/15 0445  WBC 19.7* 20.0* 6.8 10.9*  NEUTROABS 16.9*  --   --   --   HGB 11.5* 10.8* 10.7* 10.0*  HCT 34.3* 32.8* 32.1* 30.5*  MCV 95.5 95.1 96.4 93.8  PLT 175 149* 136* 142*   Cardiac Enzymes: No results for input(s): CKTOTAL, CKMB, CKMBINDEX, TROPONINI in the last 168 hours. BNP (last 3 results) No results for input(s): BNP in the last 8760 hours.  ProBNP (last 3 results) No results for input(s): PROBNP in the last 8760 hours.  CBG: No results for input(s): GLUCAP in the last 168 hours.  Recent Results (from the past 240 hour(s))  Culture, blood (routine x 2)     Status: None (Preliminary result)   Collection Time: 05/28/15  3:47 PM  Result Value Ref Range Status   Specimen Description BLOOD BLOOD LEFT ARM  Final   Special Requests BOTTLES DRAWN AEROBIC AND ANAEROBIC 5CC  Final   Culture   Final    NO GROWTH 4 DAYS Performed at [facility]    Report Status PENDING  Incomplete  Urine culture     Status: None   Collection Time: 05/28/15  3:47 PM  Result Value Ref Range Status   Specimen Description URINE, CLEAN CATCH  Final   Special Requests NONE  Final   Culture   Final    >=100,000 COLONIES/mL ESCHERICHIA COLI Performed at [facility]    Report Status 05/30/2015 FINAL  Final   Organism ID, Bacteria ESCHERICHIA COLI  Final      Susceptibility   Escherichia coli - MIC*    AMPICILLIN <=2 SENSITIVE Sensitive     CEFAZOLIN <=4 SENSITIVE Sensitive     CEFTRIAXONE <=1 SENSITIVE Sensitive     CIPROFLOXACIN  <=0.25 SENSITIVE Sensitive     GENTAMICIN <=1 SENSITIVE Sensitive     IMIPENEM <=0.25 SENSITIVE Sensitive     NITROFURANTOIN <=16 SENSITIVE Sensitive     TRIMETH/SULFA <=20 SENSITIVE Sensitive     AMPICILLIN/SULBACTAM <=2 SENSITIVE Sensitive     PIP/TAZO <=4 SENSITIVE Sensitive     * >=100,000 COLONIES/mL ESCHERICHIA COLI  Culture, blood (routine x 2)     Status: None (Preliminary result)   Collection Time: 05/28/15  9:17 PM  Result Value Ref Range Status   Specimen Description BLOOD BLOOD RIGHT ARM  Final   Special Requests IN PEDIATRIC BOTTLE 3CC  Final   Culture   Final    NO GROWTH 3 DAYS Performed at [facility]    Report Status PENDING  Incomplete  Urine culture     Status: None (Preliminary result)   Collection Time: 05/31/15  4:17 PM  Result Value Ref Range Status   Specimen Description  URINE, RANDOM  Final   Special Requests NONE  Final   Culture   Final    NO GROWTH < 24 HOURS Performed at [facility]    Report Status PENDING  Incomplete     Studies: Dg Chest Port 1 View  05/31/2015  CLINICAL DATA:  Short of breath for 1 day EXAM: PORTABLE CHEST 1 VIEW COMPARISON:  05/28/2015 FINDINGS: Moderate cardiomegaly. Diffuse interstitial edema. Central airspace edema. No pneumothorax. IMPRESSION: CHF with pulmonary edema. Electronically Signed   By: Marybelle Killings M.D.   On: 05/31/2015 21:23    Scheduled Meds: . antiseptic oral rinse  7 mL Mouth Rinse BID  . carbidopa-levodopa  2 tablet Oral TID  . cefTRIAXone (ROCEPHIN)  IV  1 g Intravenous Q24H  . diphenhydrAMINE  25 mg Oral Once  . docusate sodium  50 mg Oral BID  . enoxaparin (LOVENOX) injection  30 mg Subcutaneous Q24H  . furosemide  40 mg Intravenous Once  . psyllium  1 packet Oral Daily   Continuous Infusions: . sodium chloride 10 mL/hr at 06/01/15 0100     Time spent: 25 minutes    Louellen Molder  [facility] Pager [phone] If 7PM-7AM, please contact night-coverage at  [email or web address], password [facility] 06/01/2015, 3:32 PM  LOS: 4 days

## 2015-06-02 DIAGNOSIS — Z789 Other specified health status: Secondary | ICD-10-CM

## 2015-06-02 DIAGNOSIS — A4151 Sepsis due to Escherichia coli [E. coli]: Secondary | ICD-10-CM | POA: Insufficient documentation

## 2015-06-02 DIAGNOSIS — I5021 Acute systolic (congestive) heart failure: Secondary | ICD-10-CM | POA: Clinically undetermined

## 2015-06-02 LAB — CULTURE, BLOOD (ROUTINE X 2): Culture: NO GROWTH

## 2015-06-02 LAB — BASIC METABOLIC PANEL
ANION GAP: 9 (ref 5–15)
BUN: 53 mg/dL — ABNORMAL HIGH (ref 6–20)
CALCIUM: 8.6 mg/dL — AB (ref 8.9–10.3)
CO2: 23 mmol/L (ref 22–32)
Chloride: 106 mmol/L (ref 101–111)
Creatinine, Ser: 2.18 mg/dL — ABNORMAL HIGH (ref 0.61–1.24)
GFR calc Af Amer: 30 mL/min — ABNORMAL LOW (ref >60)
GFR, EST NON AFRICAN AMERICAN: 26 mL/min — AB (ref >60)
GLUCOSE: 122 mg/dL — AB (ref 65–99)
Potassium: 4.1 mmol/L (ref 3.5–5.1)
SODIUM: 138 mmol/L (ref 135–145)

## 2015-06-02 LAB — CBC WITH DIFFERENTIAL/PLATELET
BASOS ABS: 0 10*3/uL (ref 0.0–0.1)
BASOS PCT: 0 %
EOS PCT: 0 %
Eosinophils Absolute: 0 10*3/uL (ref 0.0–0.7)
HEMATOCRIT: 33.3 % — AB (ref 39.0–52.0)
Hemoglobin: 11.1 g/dL — ABNORMAL LOW (ref 13.0–17.0)
Lymphocytes Relative: 6 %
Lymphs Abs: 0.7 10*3/uL (ref 0.7–4.0)
MCH: 31.5 pg (ref 26.0–34.0)
MCHC: 33.3 g/dL (ref 30.0–36.0)
MCV: 94.6 fL (ref 78.0–100.0)
MONO ABS: 1.6 10*3/uL — AB (ref 0.1–1.0)
Monocytes Relative: 13 %
NEUTROS ABS: 9.8 10*3/uL — AB (ref 1.7–7.7)
Neutrophils Relative %: 81 %
PLATELETS: 176 10*3/uL (ref 150–400)
RBC: 3.52 MIL/uL — AB (ref 4.22–5.81)
RDW: 14 % (ref 11.5–15.5)
WBC: 12.1 10*3/uL — AB (ref 4.0–10.5)

## 2015-06-02 MED ORDER — MORPHINE SULFATE (PF) 2 MG/ML IV SOLN
1.0000 mg | INTRAVENOUS | Status: DC | PRN
  Administered 2015-06-02 (×3): 1 mg via INTRAVENOUS
  Filled 2015-06-02 (×3): qty 1

## 2015-06-02 MED ORDER — FUROSEMIDE 20 MG PO TABS: 20.0000 mg | ORAL_TABLET | Freq: Every day | ORAL | Status: AC

## 2015-06-02 MED ORDER — LORAZEPAM 0.5 MG PO TABS: 0.5000 mg | ORAL_TABLET | Freq: Four times a day (QID) | ORAL | Status: AC | PRN

## 2015-06-02 MED ORDER — MORPHINE SULFATE (CONCENTRATE) 10 MG /0.5 ML PO SOLN: 10.0000 mg | ORAL | Status: AC | PRN

## 2015-06-02 NOTE — Clinical Social Work Placement (Signed)
   CLINICAL SOCIAL WORK PLACEMENT  NOTE  Date:  06/02/2015  Patient Details  Name: Richard Mcgee MRN: [id number] Date of Birth: October 09, 1927  Clinical Social Work is seeking post-discharge placement for this patient at the [place] level of care (*CSW will initial, date and re-position this form in  chart as items are completed):  Yes   Patient/family provided with [place] Work Department's list of facilities offering this level of care within the geographic area requested by the patient (or if unable, by the patient's family).  Yes   Patient/family informed of their freedom to choose among providers that offer the needed level of care, that participate in Medicare, Medicaid or managed care program needed by the patient, have an available bed and are willing to accept the patient.  Yes   Patient/family informed of [facility]'s ownership interest in [facility] and [facility], as well as of the fact that they are under no obligation to receive care at these facilities.  PASRR submitted to EDS on 05/30/15     PASRR number received on       Existing PASRR number confirmed on       FL2 transmitted to all facilities in geographic area requested by pt/family on 05/30/15     FL2 transmitted to all facilities within larger geographic area on       Patient informed that his/her managed care company has contracts with or will negotiate with certain facilities, including the following:        Yes   Patient/family informed of bed offers received.  Patient chooses bed at Other - please specify in the comment section below: [facility])     Physician recommends and patient chooses bed at      Patient to be transferred to Other - please specify in the comment section below: ([facility]) on 06/02/15.  Patient to be transferred to facility by ambulance [name])     Patient family notified on 06/02/15 of transfer.  Name of family member notified:   pt daughter-in-law, [name] notified at bedside     PHYSICIAN Please sign FL2, Please sign DNR     Additional Comment:    _______________________________________________ [name], LCSW 06/02/2015, 3:23 PM

## 2015-06-02 NOTE — Evaluation (Signed)
{  EVAL NOTESSLP Cancellation Note  Patient Details Name: Richard Mcgee MRN: [id number] DOB: [birth year]   Cancelled treatment:       Reason Eval/Treat Not Completed: Other (comment) (order cancelled by MD, please reorder if indicated)   Luanna Salk, [place] [facility] SLP [phone]

## 2015-06-02 NOTE — Progress Notes (Signed)
CSW continuing to follow.   CSW met with pt daughter-in-law, Richard Mcgee at bedside. CSW discussed consult for residential hospice. Pt daughter-in-law expressed understanding as pt family met with palliative care MD this morning. CSW offered choice for residential hospice and pt family interested in [facility]. CSW discussed process and provided support.  CSW made referral to [facility], [place]. [place] liaison will process referral and notify CSW of decision.   CSW to continue to follow to provide support and assist with pt disposition needs.   Richard Mcgee, MSW, [place] Work [phone]

## 2015-06-02 NOTE — Consult Note (Addendum)
Consultation Note Date: 06/02/2015   Patient Name: Richard Mcgee  DOB: [birth year]  MRN: [id number]  Age / Sex: 81 y.o., male  PCP: Lajean Manes, MD Referring Physician: Louellen Molder, MD  Reason for Consultation: Disposition, Establishing goals of care and Hospice Evaluation    Clinical Assessment/Narrative: 81 yo man with advanced Parkinson's disease, severe aortic stenosis and vascular/parkinsons dementia with progressive decline for the past 6 months. He was admitted on  11/26 with worsening dyspnea, agitation, mental status changes and weakness. Family requested palliative consultation for goals of care and hospice options. This gentleman was explicit in his desire to allow for a natural death to occur and to not prolong his life in his current debilitated state of health.  Contacts/Participants in Discussion: Primary Decision Maker: Luther Hearing   Relationship to Patient daughter-in-law HCPOA: yes  Wife and son present and involved in decision making.  SUMMARY OF RECOMMENDATIONS  Code Status/Advance Care Planning: DNR    Code Status Orders        Start     Ordered   05/28/15 1948  Do not attempt resuscitation (DNR)   Continuous    Question Answer Comment  In the event of cardiac or respiratory ARREST Do not call a "code blue"   In the event of cardiac or respiratory ARREST Do not perform Intubation, CPR, defibrillation or ACLS   In the event of cardiac or respiratory ARREST Use medication by any route, position, wound care, and other measures to relive pain and suffering. May use oxygen, suction and manual treatment of airway obstruction as needed for comfort.      05/28/15 1947    Advance Directive Documentation        Most Recent Value   Type of Advance Directive  Out of facility DNR (pink MOST or yellow form)   Pre-existing out of facility DNR order (yellow form or pink MOST form)  Physician  notified to receive inpatient order   "MOST" Form in Place?        Other Directives:Advanced Directive  Symptom Management:   Family desires short trial of treatment but primary focus is comfort  Currently not having pain  He has sundowning issues and agitation- responds best to benedryl  Palliative Prophylaxis:   Aspiration, Bowel Regimen, Delirium Protocol and Oral Care  Additional Recommendations (Limitations, Scope, Preferences):  Avoid Hospitalization and Minimize Medications  Treat heart failure conservatively with diuretics  Transition to oral antibiotics  Caution with parkinson medications and antipsychotics  Psycho-social/Spiritual:  Support System: Strong Desire for further Chaplaincy support:yes Additional Recommendations: Caregiving  Support/Resources  Prognosis: <6 months  Discharge Planning: Patient has LTC insurance that pays for in home caregivers- family weighing home hospice with caregivers vs. Short term SNF- they are inquiring about hospice services- some concern that LTC wont pay for in home caregivers with hospice- I suggested they contact provider to get clarity on that coverage.   Chief Complaint/ Primary Diagnoses: Present on Admission:  . UTI (lower urinary tract infection) . Renal cell carcinoma of right kidney metastatic to other site [facility]) . Parkinson's disease (tremor, stiffness, slow motion, unstable posture) ([place]) . CKD (chronic kidney disease), stage III . Acute encephalopathy . Chronic diastolic CHF (congestive heart failure), NYHA class 1 ([place]) . Sepsis ([place])  I have reviewed the medical record, interviewed the patient and family, and examined the patient. The following aspects are pertinent.  Past Medical History  Diagnosis Date  . Hypertension   . GERD (gastroesophageal reflux disease)   .  Parkinsonism ([place])   . Renal cell cancer ([place]) 2010  . Degenerative disc disease, cervical   . Degenerative disc disease, lumbar   .  Osteoarthritis   . Depression   . H/O rotator cuff tear   . BPH (benign prostatic hyperplasia)   . Renal insufficiency   . Lung nodules    Social History   Social History  . Marital Status: Married    Spouse Name: N/A  . Number of Children: N/A  . Years of Education: N/A   Occupational History  . 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 Smoking status: Former Research scientist (life sciences)  . Smokeless tobacco: Never Used     Comment: quit 1970's  . Alcohol Use: No     Comment: 1 time every 4 weeks  . Drug Use: No  . Sexual Activity: Not Asked   Other Topics Concern  . None   Social History Narrative   Lives with wife in a one story home.  Has 2 children.  Retired from [employer].     Family History  Problem Relation Age of Onset  . CAD    . Hypertension    . CVA    . Diabetes    . Kidney cancer    . Depression     Scheduled Meds: . antiseptic oral rinse  7 mL Mouth Rinse BID  . carbidopa-levodopa  2 tablet Oral TID  . cefTRIAXone (ROCEPHIN)  IV  1 g Intravenous Q24H  . diphenhydrAMINE  25 mg Oral Once  . docusate sodium  50 mg Oral BID  . furosemide  20 mg Intravenous Daily  . psyllium  1 packet Oral Daily   Continuous Infusions: . sodium chloride 10 mL/hr at 06/01/15 0100   PRN Meds:.acetaminophen **OR** acetaminophen, morphine injection, ondansetron **OR** ondansetron (ZOFRAN) IV, polyethylene glycol Medications Prior to Admission:  Prior to Admission medications   Medication Sig Start Date End Date Taking? Authorizing Provider  carbidopa-levodopa (SINEMET IR) 25-100 MG tablet Take 2 tablets by mouth 3 (three) times daily. 05/10/15  Yes Chauncey Cruel, MD  Cholecalciferol 2000 UNITS CAPS Take 2,000 Units by mouth daily.   Yes Historical Provider, MD  docusate sodium (COLACE) 50 MG capsule Take 50 mg by mouth 2 (two) times daily.   Yes Historical Provider, MD  Misc Natural  Products (FIBER 7) POWD Take 1 packet by mouth daily.    Yes Historical Provider, MD  Multiple Vitamin (MULTIVITAMIN) tablet Take 1 tablet by mouth daily.   Yes Historical Provider, MD  polyethylene glycol (MIRALAX / GLYCOLAX) packet Take 17 g by mouth daily as needed for moderate constipation.    Yes Historical Provider, MD   Allergies  Allergen Reactions  . Sulfa Antibiotics     Patient's son states that he is not aware that patient has this allergy. No known allergic events.    Review of Systems  Unable to perform ROS   Physical Exam  HENT:  Mouth/Throat: Oropharynx is clear and moist. No oropharyngeal exudate.  Eyes: Pupils are equal, round, and reactive to light.  Cardiovascular: Normal rate.   Respiratory: He has rales.  GI: He exhibits no distension.  Musculoskeletal: He exhibits edema.  Skin: He is diaphoretic.    Vital Signs: BP 164/72 mmHg  Pulse 84  Temp(Src) 98.2 F (36.8 C) (Axillary)  Resp 20  Ht 5\' 6"  (1.676 m)  Wt 64.9  kg (143 lb 1.3 oz)  BMI 23.10 kg/m2  SpO2 96%  SpO2: SpO2: 96 % O2 Device:SpO2: 96 % O2 Flow Rate: .O2 Flow Rate (L/min): 3 L/min  IO: Intake/output summary:  Intake/Output Summary (Last 24 hours) at 06/02/15 [id number] Last data filed at 06/02/15 0548  Gross per 24 hour  Intake    840 ml  Output   1450 ml  Net   -610 ml    LBM: Last BM Date: 05/30/15 Baseline Weight: Weight: 63.504 kg (140 lb) Most recent weight: Weight: 64.9 kg (143 lb 1.3 oz)      Palliative Assessment/Data:  Flowsheet Rows        Most Recent Value   Intake Tab    Referral Department  Hospitalist   Unit at Time of Referral  Oncology Unit   Palliative Care Primary Diagnosis  Cancer   Date Notified  06/01/15   Palliative Care Type  New Palliative care   Reason for referral  Clarify Goals of Care   Date of Admission  05/28/15   # of days IP prior to Palliative referral  4   Clinical Assessment    Psychosocial & Spiritual Assessment    Palliative Care Outcomes        Additional Data Reviewed:  CBC:    Component Value Date/Time   WBC 12.1* 06/02/2015 0519   WBC 9.2 05/02/2015 1034   HGB 11.1* 06/02/2015 0519   HGB 12.2* 05/02/2015 1034   HCT 33.3* 06/02/2015 0519   HCT 36.9* 05/02/2015 1034   PLT 176 06/02/2015 0519   PLT 212 05/02/2015 1034   MCV 94.6 06/02/2015 0519   MCV 96.3 05/02/2015 1034   NEUTROABS 9.8* 06/02/2015 0519   NEUTROABS 7.3* 05/02/2015 1034   LYMPHSABS 0.7 06/02/2015 0519   LYMPHSABS 0.9 05/02/2015 1034   MONOABS 1.6* 06/02/2015 0519   MONOABS 0.8 05/02/2015 1034   EOSABS 0.0 06/02/2015 0519   EOSABS 0.1 05/02/2015 1034   BASOSABS 0.0 06/02/2015 0519   BASOSABS 0.1 05/02/2015 1034   Comprehensive Metabolic Panel:    Component Value Date/Time   NA 138 06/02/2015 0519   NA 142 05/02/2015 1034   K 4.1 06/02/2015 0519   K 5.1 05/02/2015 1034   CL 106 06/02/2015 0519   CO2 23 06/02/2015 0519   CO2 24 05/02/2015 1034   BUN 53* 06/02/2015 0519   BUN 40.9* 05/02/2015 1034   CREATININE 2.18* 06/02/2015 0519   CREATININE 1.9* 05/02/2015 1034   CREATININE 1.79* 02/11/2015 1042   GLUCOSE 122* 06/02/2015 0519   GLUCOSE 114 05/02/2015 1034   CALCIUM 8.6* 06/02/2015 0519   CALCIUM 9.1 05/02/2015 1034   AST 13* 05/29/2015 0416   AST 10 05/02/2015 1034   ALT <5* 05/29/2015 0416   ALT <9 05/02/2015 1034   ALKPHOS 61 05/29/2015 0416   ALKPHOS 98 05/02/2015 1034   BILITOT 0.9 05/29/2015 0416   BILITOT 0.44 05/02/2015 1034   PROT 5.8* 05/29/2015 0416   PROT 6.3* 05/02/2015 1034   ALBUMIN 3.5 05/29/2015 0416   ALBUMIN 3.7 05/02/2015 1034     Time In: 430  Time Out: 540 Time Total: 70 minutes Greater than 50%  of this time was spent counseling and coordinating care related to the above assessment and plan.  Signed by: Roma Schanz, DO  06/02/2015, 9:39 AM  Please contact Palliative Medicine Team phone at [phone] for questions and concerns.

## 2015-06-02 NOTE — Progress Notes (Signed)
Daily Progress Note   Patient Name: Richard Mcgee       Date: 06/02/2015 DOB: 07/24/1927  Age: 81 y.o. MRN#: [id number] Attending Physician: Louellen Molder, MD Primary Care Physician: Mathews Argyle, MD Admit Date: 05/28/2015  Reason for Consultation/Follow-up: Establishing goals of care  Subjective: Looks much worse worse this AM, worsening pulmonary edema overnight. Family in the room requesting transition to comfort care. Patient very agitated.Persistent fever, bloody sputum this AM. Interval Events: Admitted 11/26 PMT consult 12/30  Length of Stay: 5 days  Current Medications: Scheduled Meds:  . antiseptic oral rinse  7 mL Mouth Rinse BID  . carbidopa-levodopa  2 tablet Oral TID  . cefTRIAXone (ROCEPHIN)  IV  1 g Intravenous Q24H  . diphenhydrAMINE  25 mg Oral Once  . docusate sodium  50 mg Oral BID  . furosemide  20 mg Intravenous Daily  . psyllium  1 packet Oral Daily    Continuous Infusions: . sodium chloride 10 mL/hr at 06/01/15 0100    PRN Meds: acetaminophen **OR** acetaminophen, morphine injection, ondansetron **OR** ondansetron (ZOFRAN) IV, polyethylene glycol  Physical Exam: Physical Exam              Vital Signs: BP 164/72 mmHg  Pulse 84  Temp(Src) 98.2 F (36.8 C) (Axillary)  Resp 20  Ht 5\' 6"  (1.676 m)  Wt 64.9 kg (143 lb 1.3 oz)  BMI 23.10 kg/m2  SpO2 96% SpO2: SpO2: 96 % O2 Device: O2 Device: Nasal Cannula O2 Flow Rate: O2 Flow Rate (L/min): 3 L/min  Intake/output summary:  Intake/Output Summary (Last 24 hours) at 06/02/15 0953 Last data filed at 06/02/15 0548  Gross per 24 hour  Intake    600 ml  Output   1450 ml  Net   -850 ml   LBM: Last BM Date: 05/30/15 Baseline Weight: Weight: 63.504 kg (140 lb) Most recent weight: Weight:  64.9 kg (143 lb 1.3 oz)       Palliative Assessment/Data: Flowsheet Rows        Most Recent Value   Intake Tab    Referral Department  Hospitalist   Unit at Time of Referral  Oncology Unit   Palliative Care Primary Diagnosis  Cancer   Date Notified  06/01/15   Palliative Care Type  New Palliative care   Reason for  referral  Clarify Goals of Care   Date of Admission  05/28/15   # of days IP prior to Palliative referral  4   Clinical Assessment    Psychosocial & Spiritual Assessment    Palliative Care Outcomes       Additional Data Reviewed: CBC    Component Value Date/Time   WBC 12.1* 06/02/2015 0519   WBC 9.2 05/02/2015 1034   RBC 3.52* 06/02/2015 0519   RBC 3.83* 05/02/2015 1034   HGB 11.1* 06/02/2015 0519   HGB 12.2* 05/02/2015 1034   HCT 33.3* 06/02/2015 0519   HCT 36.9* 05/02/2015 1034   PLT 176 06/02/2015 0519   PLT 212 05/02/2015 1034   MCV 94.6 06/02/2015 0519   MCV 96.3 05/02/2015 1034   MCH 31.5 06/02/2015 0519   MCH 31.8 05/02/2015 1034   MCHC 33.3 06/02/2015 0519   MCHC 33.0 05/02/2015 1034   RDW 14.0 06/02/2015 0519   RDW 13.7 05/02/2015 1034   LYMPHSABS 0.7 06/02/2015 0519   LYMPHSABS 0.9 05/02/2015 1034   MONOABS 1.6* 06/02/2015 0519   MONOABS 0.8 05/02/2015 1034   EOSABS 0.0 06/02/2015 0519   EOSABS 0.1 05/02/2015 1034   BASOSABS 0.0 06/02/2015 0519   BASOSABS 0.1 05/02/2015 1034    CMP     Component Value Date/Time   NA 138 06/02/2015 0519   NA 142 05/02/2015 1034   K 4.1 06/02/2015 0519   K 5.1 05/02/2015 1034   CL 106 06/02/2015 0519   CO2 23 06/02/2015 0519   CO2 24 05/02/2015 1034   GLUCOSE 122* 06/02/2015 0519   GLUCOSE 114 05/02/2015 1034   BUN 53* 06/02/2015 0519   BUN 40.9* 05/02/2015 1034   CREATININE 2.18* 06/02/2015 0519   CREATININE 1.9* 05/02/2015 1034   CREATININE 1.79* 02/11/2015 1042   CALCIUM 8.6* 06/02/2015 0519   CALCIUM 9.1 05/02/2015 1034   PROT 5.8* 05/29/2015 0416   PROT 6.3* 05/02/2015 1034   ALBUMIN 3.5  05/29/2015 0416   ALBUMIN 3.7 05/02/2015 1034   AST 13* 05/29/2015 0416   AST 10 05/02/2015 1034   ALT <5* 05/29/2015 0416   ALT <9 05/02/2015 1034   ALKPHOS 61 05/29/2015 0416   ALKPHOS 98 05/02/2015 1034   BILITOT 0.9 05/29/2015 0416   BILITOT 0.44 05/02/2015 1034   GFRNONAA 26* 06/02/2015 0519   GFRAA 30* 06/02/2015 0519       Problem List:  Patient Active Problem List   Diagnosis Date Noted  . FUO (fever of unknown origin)   . Goals of care, counseling/discussion   . UTI (lower urinary tract infection) 05/28/2015  . Acute encephalopathy 05/28/2015  . Chronic diastolic CHF (congestive heart failure), NYHA class 1 ([place]) 05/28/2015  . Sepsis ([place]) 05/28/2015  . 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 Visual hallucinations 11/12/2014  . Renal cell carcinoma of right kidney metastatic to other site ([place]) 11/11/2014  . Parkinson's disease (tremor, stiffness, slow motion, unstable posture) ([place]) 11/11/2014  . CKD (chronic kidney disease), stage III 11/11/2014     Palliative Care Assessment & Plan    1.Code Status:  DNR    Code Status Orders        Start     Ordered   05/28/15 1948  Do not attempt resuscitation (DNR)   Continuous    Question Answer Comment  In the event of cardiac or respiratory ARREST Do not call a "code blue"   In the event of cardiac or respiratory ARREST Do not  perform Intubation, CPR, defibrillation or ACLS   In the event of cardiac or respiratory ARREST Use medication by any route, position, wound care, and other measures to relive pain and suffering. May use oxygen, suction and manual treatment of airway obstruction as needed for comfort.      05/28/15 1947    Advance Directive Documentation        Most Recent Value   Type of Advance Directive  Out of facility DNR (pink MOST or yellow form)   Pre-existing out of facility DNR order (yellow form or pink MOST form)  Physician notified to receive inpatient order   "MOST"  Form in Place?         2. Goals of Care/Additional Recommendations:  Full comfort Care  Limitations on Scope of Treatment: Avoid Hospitalization, Full Comfort Care, Minimize Medications, Initiate Comfort Feeding, No Diagnostics, No IV Antibiotics and No Lab Draws  Desire for further Chaplaincy support:yes  Psycho-social Needs: Caregiving  Support/Resources  3. Symptom Management: 1.Will start IV morphine PRN for dyspnea 2. Ativan IV for agitation 3. Comfort feeding as tolerated 4. Maintain his oral parkinson's meds for as long as he is able to take them  4. Palliative Prophylaxis:   Aspiration, Bowel Regimen, Delirium Protocol and Oral Care  5. Prognosis: < 2 weeks  6. Discharge Planning:  Hospice facility - family requesting [place] was discussed with Patient's wife, daughter-in-law  Thank you for allowing the Palliative Medicine Team to assist in the care of this patient.   Time In: 900 Time Out: 935 Total Time 35 Prolonged Time Billed no        Acquanetta Chain, DO  06/02/2015, 9:53 AM  Please contact Palliative Medicine Team phone at [phone] for questions and concerns.

## 2015-06-02 NOTE — Progress Notes (Signed)
PT Cancellation Note  Patient Details Name: Richard Mcgee MRN: [id number] DOB: [birth year]   Cancelled Treatment:     pt D/C to [facility] today   Nathanial Rancher 06/02/2015, 2:43 PM

## 2015-06-02 NOTE — Discharge Summary (Signed)
Physician Discharge Summary  Richard Mcgee [id number] DOB: 06/23/1928 DOA: 05/28/2015  PCP: Mathews Argyle, MD  Admit date: 05/28/2015 Discharge date: 06/02/2015  Time spent:35 minutes  Recommendations for Outpatient Follow-up:  1. Discharge to residential hospice [facility]) for full comfort   Scope of treatment:   full comfort measures, avoiding hospitalization, minimize medications, comfort feeding, non-diagnostic workup, blood draws or IV antibiotics. No resuscitation.   Discharge Diagnoses:  Principal Problem:   Sepsis ([place])   Active Problems:   Renal cell carcinoma of right kidney metastatic to other site [facility])   Parkinson's disease (tremor, stiffness, slow motion, unstable posture) (HCC)   CKD (chronic kidney disease), stage III   UTI (lower urinary tract infection)   Acute encephalopathy   Chronic diastolic CHF (congestive heart failure), NYHA class 1 (HCC)   FUO (fever of unknown origin)   Goals of care, counseling/discussion   Acute systolic CHF (congestive heart failure) ([place])   Discharge Condition: Guarded  Diet recommendation: Comfort feeds as tolerated  CODE STATUS: DO NOT RESUSCITATE  Filed Weights   05/28/15 1509 05/28/15 2048  Weight: 63.504 kg (140 lb) 64.9 kg (143 lb 1.3 oz)    History of present illness:  Please refer to admission H&P for details, in brief,81 year old male with history of hypertension, GERD, parkinsonism renal cell cancer with bilateral lung metastases (slowly progressive), degenerative disc disease, depression, BPH presented with sepsis (fever of 101.4 Fahrenheit, tachypnea ,, confusion with leukocytosis and acute kidney injury). UA was positive for UTI. admitted to hospitalist service for sepsis and acute encephalopathy. Hospital Course complicated with persistent fever, on improved encephalopathy and acute CHF.   Hospital Course:  Sepsis with acute encephalopathy Secondary to Escherichia coli UTI On empiric  Rocephin which is pansensitive. However despite several days of antibiotics patient having temperature spikes. ID consult appreciated , initially planned for extensive workup but family requested against aggressive tests. Fever could possibly be due to renal cell carcinoma or drug-induced versus silent aspiration. -goal now full for comfort.   Acute metabolic encephalopathy Thought to be secondary to sepsis. Patient at baseline having gradual progressive mental status changes from his underlying Parkinson's disease. On improved despite covering adequately with antibiotics. Appreciate palliative care evaluation.  Acute systolic CHF Patient reportedly became short of breath on 11/29. Chest x-ray done showed findings of CHF with pulmonary edema. Is history of diastolic dysfunction. 2-D echo done today showing severely reduced systolic function with EF of 25-30% and grade 1 diastolic dysfunction. Shows moderate aortic stenosis and PA pressure 49 mmHg. Also shows trivial pericardial effusion.  Patient diuresed well on low-dose Lasix.  continue upon discharge. Since patient being discharged to hospice for comfort no other medications added.   Acute on chronic kidney disease stage III Secondary to sepsis.  Parkinson's disease Continue Sinemet.  Renal cell carcinoma of the right kidney with distant metastases Not on any treatment. Has underlying chronic kidney disease.  Goals of care Family initially came for skilled nursing versus short-term rehabilitation so that patient could eventually come home. however still course further complicated with worsening mental status, worsening pulmonary edema persistent fever and episode of hemoptysis this morning. Discussed in detail with patient's daughter-in-law (son also involved in care) . Palliative care team also involved in goals of care discussion and given his rapid progressive decline, family agree on residential hospice for full comfort. I will  discharge him on Roxanol as needed for pain and shortness of breath and low-dose Ativan as needed for anxiety and sleep.  Family Communication: Daughter-in-law at bedside Disposition Plan: Residential hospice   Consultants:  ID  Palliative care  Procedures:  None  Antibiotics:  IV Rocephin 11/26--12/1    Discharge Exam: Filed Vitals:   06/01/15 2246 06/02/15 0547  BP: 142/88 164/72  Pulse: 89 84  Temp: 99.1 F (37.3 C) 98.2 F (36.8 C)  Resp: 22 20    General: Elderly male not in distress, confused  HEENT: No pallor, moist oral mucosa, no JVD  Chest: Bibasilar crackles, no rhonchi or wheeze  Cardiovascular: Normal S1 and S2, systolic murmur 3/6, no rubs or gallop  GI: Soft, nondistended, nontender, bowel sounds present  Musculoskeletal: Warm, trace edema  CNS: Alert and awake, oriented x0, resting tremors   Discharge Instructions    Current Discharge Medication List    START taking these medications   Details  furosemide (LASIX) 20 MG tablet Take 1 tablet (20 mg total) by mouth daily. Qty: 30 tablet, Refills: 0    LORazepam (ATIVAN) 0.5 MG tablet Take 1 tablet (0.5 mg total) by mouth every 6 (six) hours as needed for anxiety or sleep. Qty: 20 tablet, Refills: 0    Morphine Sulfate (MORPHINE CONCENTRATE) 10 mg / 0.5 ml concentrated solution Take 0.5 mLs (10 mg total) by mouth every 4 (four) hours as needed for moderate pain, severe pain or shortness of breath. Qty: 15 mL, Refills: 0      CONTINUE these medications which have NOT CHANGED   Details  carbidopa-levodopa (SINEMET IR) 25-100 MG tablet Take 2 tablets by mouth 3 (three) times daily. Qty: 720 tablet, Refills: 1    docusate sodium (COLACE) 50 MG capsule Take 50 mg by mouth 2 (two) times daily.    polyethylene glycol (MIRALAX / GLYCOLAX) packet Take 17 g by mouth daily as needed for moderate constipation.       STOP taking these medications     Cholecalciferol 2000 UNITS  CAPS      Misc Natural Products (FIBER 7) POWD      Multiple Vitamin (MULTIVITAMIN) tablet        Allergies  Allergen Reactions  . Sulfa Antibiotics     Patient's son states that he is not aware that patient has this allergy. No known allergic events.   Follow-up Information    Please follow up.   Why:  at [facility]       The results of significant diagnostics from this hospitalization (including imaging, microbiology, ancillary and laboratory) are listed below for reference.    Significant Diagnostic Studies: Ct Abdomen Pelvis Wo Contrast  05/09/2015  CLINICAL DATA:  Renal cell carcinoma. EXAM: CT ABDOMEN AND PELVIS WITHOUT CONTRAST TECHNIQUE: Multidetector CT imaging of the abdomen and pelvis was performed following the standard protocol without IV contrast. COMPARISON:  09/21/2011 FINDINGS: Lower chest: No pleural effusion. Pulmonary nodule in the left lower lobe measures 1.4 cm. Previously 0.7 cm. Right middle lobe pulmonary nodule mesh 0.8 cm, image 1 of series 4. Previously 0.4 cm. The heart size is enlarged. There is thoracic aortic calcification. Calcifications involving the LAD, left circumflex and RCA coronary arteries also noted. Hepatobiliary: No suspicious liver abnormality. The gallbladder is normal. There is no biliary dilatation. Pancreas: The pancreas is unremarkable. Spleen: The spleen is negative. Adrenals/Urinary Tract: Nodule in the right adrenal gland measures 1.3 cm and is unchanged from previous exam, image 18 of series 2. New adjacent nodule measures 1.4 cm, image 60 of series 602. Stable appearance of the nodular appearing left adrenal gland.  Previous right nephrectomy. Nonobstructing left renal calculi are identified measuring up to 8 mm. There is a hyperdense lesion arising from the inferior pole of the left kidney which measures 11/07/1936 GU and 1.4 cm, image 31 of series 2. Urinary bladder is largely obscured secondary to beam hardening artifact from  bilateral hip arthroplasty devices. Stomach/Bowel: The stomach is normal. The small bowel loops have a normal course and caliber without obstruction. Normal appearance of the colon. Vascular/Lymphatic: Calcified atherosclerotic disease involves the abdominal aorta. No aneurysm. No enlarged retroperitoneal or mesenteric adenopathy. No enlarged pelvic or inguinal lymph nodes. Reproductive: The prostate gland appears enlarged. Other: No free fluid or fluid collections identified. Musculoskeletal: The bones appear diffusely osteopenic. T12 compression deformity is noted with loss of approximately 30% of the vertebral body height. This is new from previous exam. IMPRESSION: 1. Pulmonary nodules in the right middle lobe and left lower lobe have increased in size from the previous exam and are worrisome for metastatic disease. 2. New nodule adjacent to the right adrenal gland is identified and also suspicious for recurrence of tumor/metastatic disease. 3. Aortic atherosclerosis and multi vessel coronary artery calcification. 4. Lumbar spondylosis with new T12 compression deformity. Electronically Signed   By: Kerby Moors M.D.   On: 05/09/2015 14:40   Dg Chest 2 View  05/28/2015  CLINICAL DATA:  Shortness of breath today. EXAM: CHEST  2 VIEW COMPARISON:  05/02/2015 and 11/11/2014 FINDINGS: Lungs are adequately inflated without focal consolidation or effusion. Mild stable cardiomegaly. Calcified plaque over the thoracoabdominal aorta. Mild anterior wedging of a mid thoracic vertebral body and vertebral body near the thoracolumbar junction unchanged. IMPRESSION: No active cardiopulmonary disease. Mild cardiomegaly. Stable anterior wedge compression deformities of the spine. Electronically Signed   By: Marin Olp M.D.   On: 05/28/2015 15:34   Dg Chest Port 1 View  05/31/2015  CLINICAL DATA:  Short of breath for 1 day EXAM: PORTABLE CHEST 1 VIEW COMPARISON:  05/28/2015 FINDINGS: Moderate cardiomegaly. Diffuse  interstitial edema. Central airspace edema. No pneumothorax. IMPRESSION: CHF with pulmonary edema. Electronically Signed   By: Marybelle Killings M.D.   On: 05/31/2015 21:23    Microbiology: Recent Results (from the past 240 hour(s))  Culture, blood (routine x 2)     Status: None   Collection Time: 05/28/15  3:47 PM  Result Value Ref Range Status   Specimen Description BLOOD BLOOD LEFT ARM  Final   Special Requests BOTTLES DRAWN AEROBIC AND ANAEROBIC 5CC  Final   Culture   Final    NO GROWTH 5 DAYS Performed at [facility]    Report Status 06/02/2015 FINAL  Final  Urine culture     Status: None   Collection Time: 05/28/15  3:47 PM  Result Value Ref Range Status   Specimen Description URINE, CLEAN CATCH  Final   Special Requests NONE  Final   Culture   Final    >=100,000 COLONIES/mL ESCHERICHIA COLI Performed at [facility]    Report Status 05/30/2015 FINAL  Final   Organism ID, Bacteria ESCHERICHIA COLI  Final      Susceptibility   Escherichia coli - MIC*    AMPICILLIN <=2 SENSITIVE Sensitive     CEFAZOLIN <=4 SENSITIVE Sensitive     CEFTRIAXONE <=1 SENSITIVE Sensitive     CIPROFLOXACIN <=0.25 SENSITIVE Sensitive     GENTAMICIN <=1 SENSITIVE Sensitive     IMIPENEM <=0.25 SENSITIVE Sensitive     NITROFURANTOIN <=16 SENSITIVE Sensitive  TRIMETH/SULFA <=20 SENSITIVE Sensitive     AMPICILLIN/SULBACTAM <=2 SENSITIVE Sensitive     PIP/TAZO <=4 SENSITIVE Sensitive     * >=100,000 COLONIES/mL ESCHERICHIA COLI  Culture, blood (routine x 2)     Status: None (Preliminary result)   Collection Time: 05/28/15  9:17 PM  Result Value Ref Range Status   Specimen Description BLOOD BLOOD RIGHT ARM  Final   Special Requests IN PEDIATRIC BOTTLE 3CC  Final   Culture   Final    NO GROWTH 4 DAYS Performed at [facility]    Report Status PENDING  Incomplete  Urine culture     Status: None   Collection Time: 05/31/15  4:17 PM  Result Value Ref Range Status   Specimen  Description URINE, RANDOM  Final   Special Requests NONE  Final   Culture   Final    1,000 COLONIES/mL INSIGNIFICANT GROWTH Performed at [facility]    Report Status 06/01/2015 FINAL  Final     Labs: Basic Metabolic Panel:  Recent Labs Lab 05/28/15 1547 05/29/15 0416 05/31/15 0410 06/01/15 0445 06/02/15 0519  NA 136 130* 135 135 138  K 5.0 4.4 4.7 4.1 4.1  CL 106 101 107 106 106  CO2 23 21* 22 19* 23  GLUCOSE 131* 112* 109* 159* 122*  BUN 43* 43* 48* 48* 53*  CREATININE 2.13* 2.12* 2.10* 2.25* 2.18*  CALCIUM 8.4* 8.1* 8.3* 8.4* 8.6*  MG  --  1.8  --   --   --   PHOS  --  2.6  --   --   --    Liver Function Tests:  Recent Labs Lab 05/28/15 1547 05/29/15 0416  AST 17 13*  ALT <5* <5*  ALKPHOS 82 61  BILITOT 0.9 0.9  PROT 6.4* 5.8*  ALBUMIN 3.9 3.5   No results for input(s): LIPASE, AMYLASE in the last 168 hours. No results for input(s): AMMONIA in the last 168 hours. CBC:  Recent Labs Lab 05/28/15 1547 05/29/15 0416 05/31/15 0410 06/01/15 0445 06/02/15 0519  WBC 19.7* 20.0* 6.8 10.9* 12.1*  NEUTROABS 16.9*  --   --   --  9.8*  HGB 11.5* 10.8* 10.7* 10.0* 11.1*  HCT 34.3* 32.8* 32.1* 30.5* 33.3*  MCV 95.5 95.1 96.4 93.8 94.6  PLT 175 149* 136* 142* 176   Cardiac Enzymes: No results for input(s): CKTOTAL, CKMB, CKMBINDEX, TROPONINI in the last 168 hours. BNP: BNP (last 3 results) No results for input(s): BNP in the last 8760 hours.  ProBNP (last 3 results) No results for input(s): PROBNP in the last 8760 hours.  CBG: No results for input(s): GLUCAP in the last 168 hours.     SignedLouellen Molder  [facility] 06/02/2015, 2:05 PM

## 2015-06-02 NOTE — Progress Notes (Signed)
Report called and given to [employer] at [facility].Pt ready for discharge.

## 2015-06-02 NOTE — Progress Notes (Signed)
CSW continuing to follow.   CSW received notification from [facility], Erling Conte that [facility] can accept pt today.   CSW notified MD.  CSW facilitated pt discharge needs including contacting facility, faxing pt discharge information to [facility], discussing with pt daughter-in-law, [name] at bedside, providing RN phone number to call report, and arranging ambulance transport for pt to [facility].   No further social work needs identified at this time.   CSW signing off.   Alison Murray, MSW, [place] Work [phone]

## 2015-06-03 LAB — CULTURE, BLOOD (ROUTINE X 2): CULTURE: NO GROWTH

## 2015-06-07 ENCOUNTER — Telehealth: Payer: Self-pay | Admitting: *Deleted

## 2015-06-07 NOTE — Telephone Encounter (Signed)
"  This is [name], daughter-n-law of Mr Jawon Alvidrez.  He has been admitted to [facility] with a few days to live.  I want to notify Dr. Jana Hakim so all future appointments will be cancelled and save my mother from future appointment reminder calls.  I can be reached at [phone]."

## 2015-06-29 ENCOUNTER — Encounter: Payer: Self-pay | Admitting: Physical Therapy

## 2015-06-30 NOTE — Therapy (Signed)
[place] [address] [place], [place], [address] Phone: [phone]   Fax:  [phone]  Patient Details  Name: Richard Mcgee MRN: [id number] Date of Birth: 1927-07-24 Referring Provider:  No ref. provider found  Encounter Date: 06/29/2015  Note created in error.  Frazier Butt. 06/30/2015, 8:22 AM Frazier Butt., PT [place] [address] [place], [place], [address] Phone: [phone]   Fax:  [phone]

## 2015-07-03 DEATH — deceased

## 2015-07-05 ENCOUNTER — Telehealth: Payer: Self-pay | Admitting: *Deleted

## 2015-07-05 NOTE — Telephone Encounter (Signed)
VM message from pt's wife to cancel upcoming appts for pt-he died on 06-09-2015 @ [facility].

## 2015-07-06 ENCOUNTER — Other Ambulatory Visit: Payer: Self-pay | Admitting: Oncology

## 2015-07-07 ENCOUNTER — Other Ambulatory Visit: Payer: Medicare Other

## 2015-07-14 ENCOUNTER — Ambulatory Visit: Payer: Medicare Other | Admitting: Oncology

## 2015-08-24 ENCOUNTER — Ambulatory Visit: Payer: Medicare Other | Admitting: Neurology

## 2016-02-15 IMAGING — CT CT ABD-PELV W/O CM
2 of 4 series · 16 of 46 positions shown, 18 images · non-contrast
Comparison: 09/21/2011

CLINICAL DATA: Renal cell carcinoma.

EXAM:
CT ABDOMEN AND PELVIS WITHOUT CONTRAST
TECHNIQUE: Multidetector CT imaging of the abdomen and pelvis was performed
following the standard protocol without IV contrast.

[Series 2: rtn a/p w/o · axial · non-contrast · 0.86mm/px · z∈[-734,-344]mm · 13 of 86 slices shown, 15 images]
[im 4/86  soft-tissue]
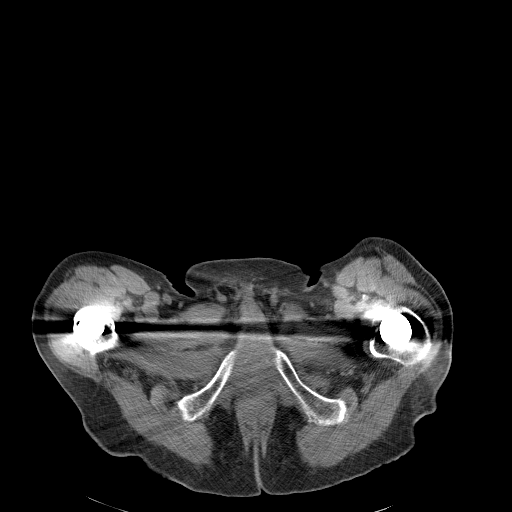
[im 4/86  bone]
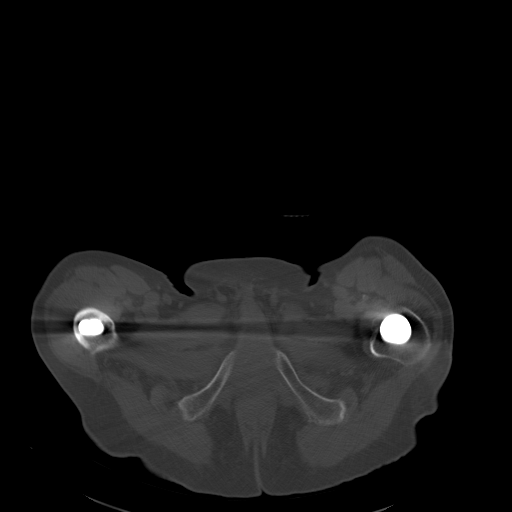
[im 11/86  soft-tissue]
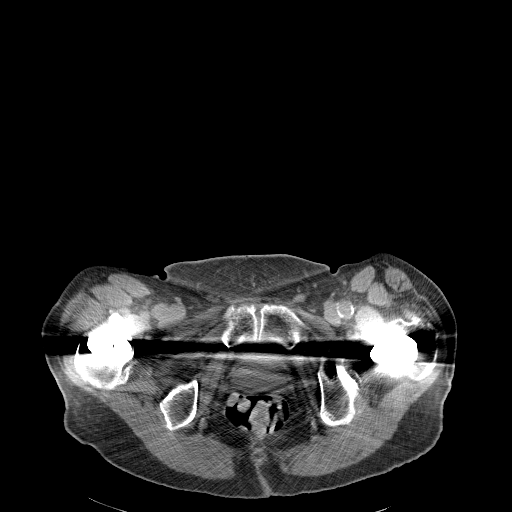
[im 18/86  soft-tissue]
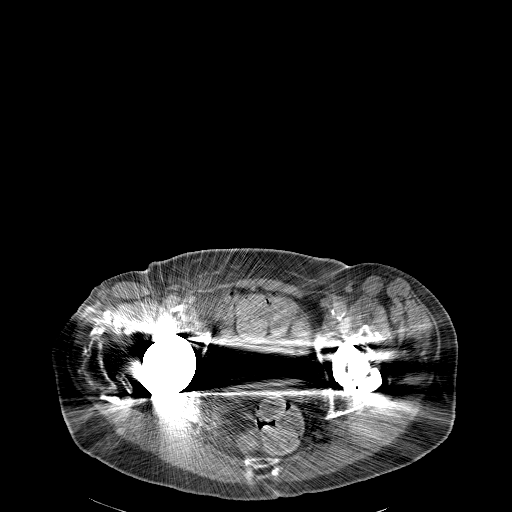
[im 25/86  soft-tissue]
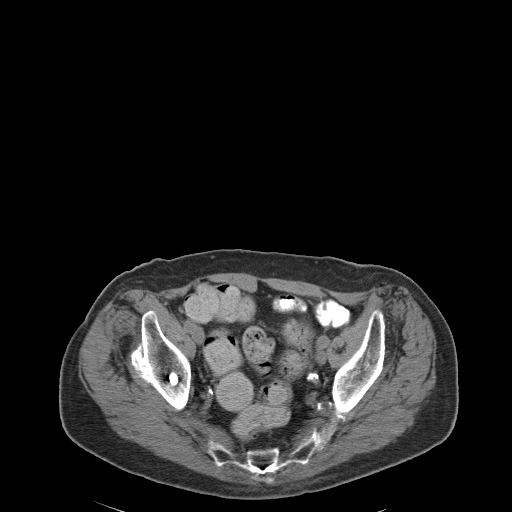
[im 29/86  soft-tissue]
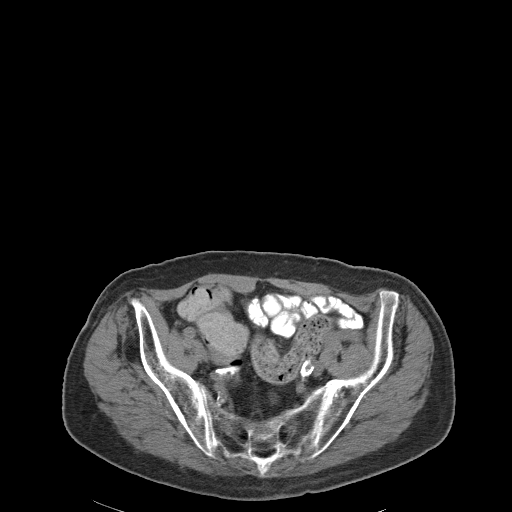
[im 36/86  soft-tissue]
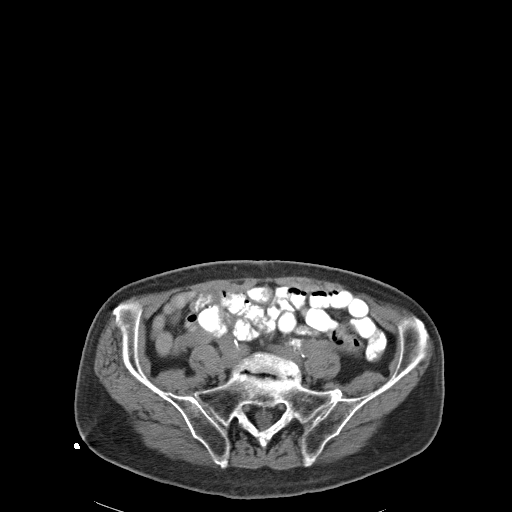
[im 43/86  soft-tissue]
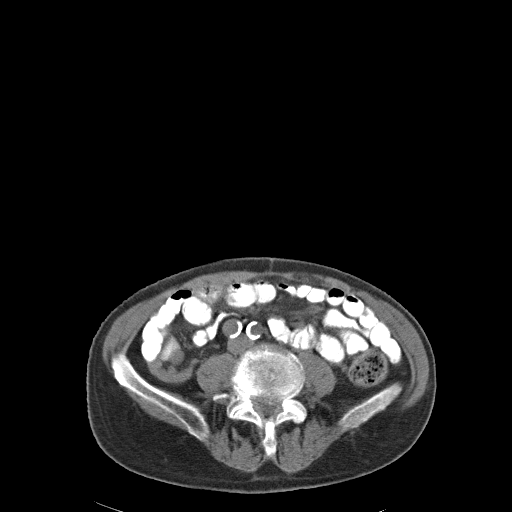
[im 50/86  soft-tissue]
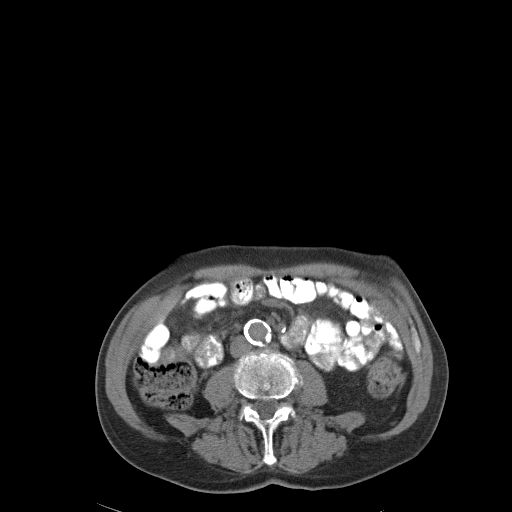
[im 57/86  soft-tissue]
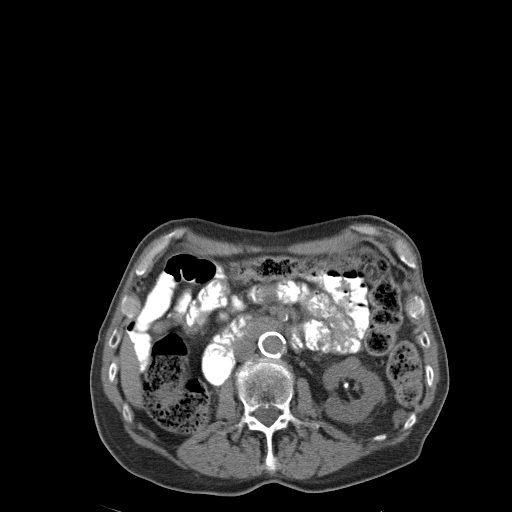
[im 57/86  bone]
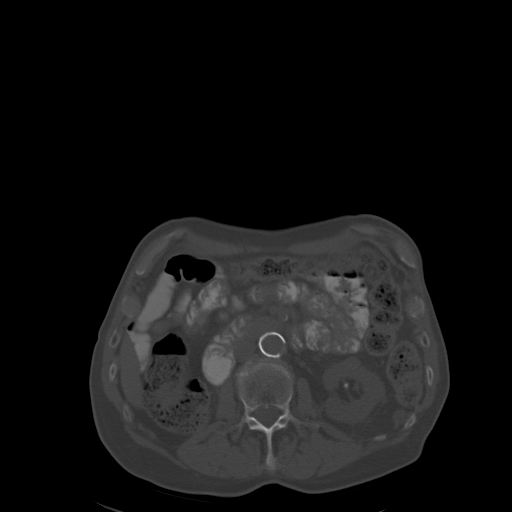
[im 61/86  soft-tissue]
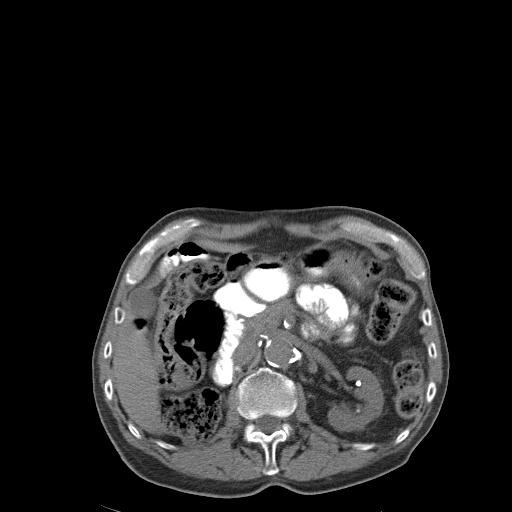
[im 68/86  soft-tissue]
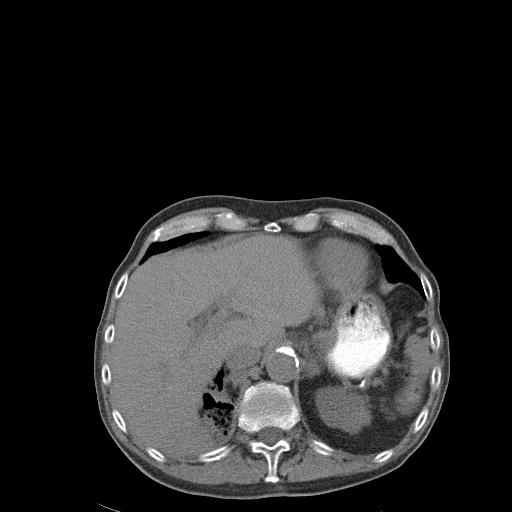
[im 75/86  soft-tissue]
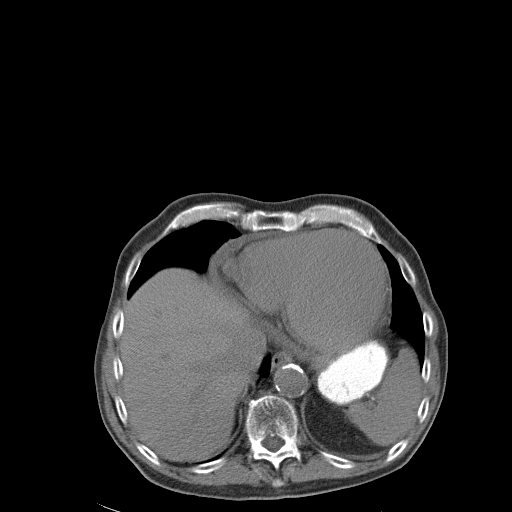
[im 82/86  soft-tissue]
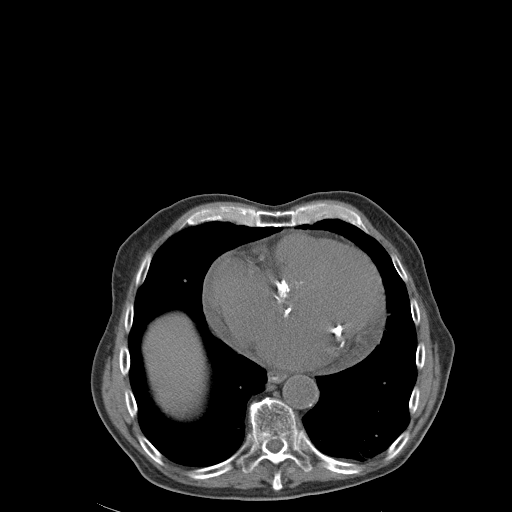

[Series 602: <mpr thick range> · coronal · 0.86mm/px · 3 of 85 slices shown]
[im 29/85  soft-tissue]
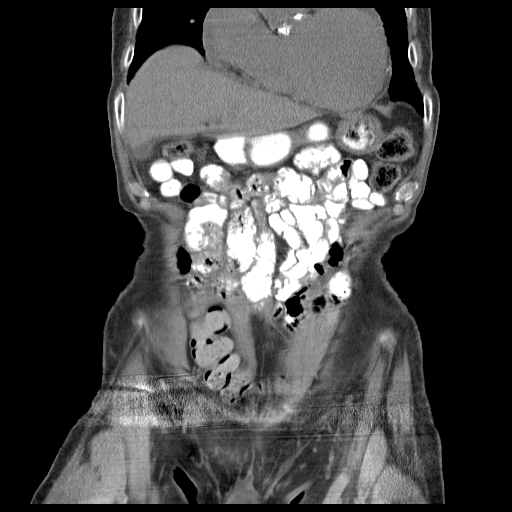
[im 38/85  soft-tissue]
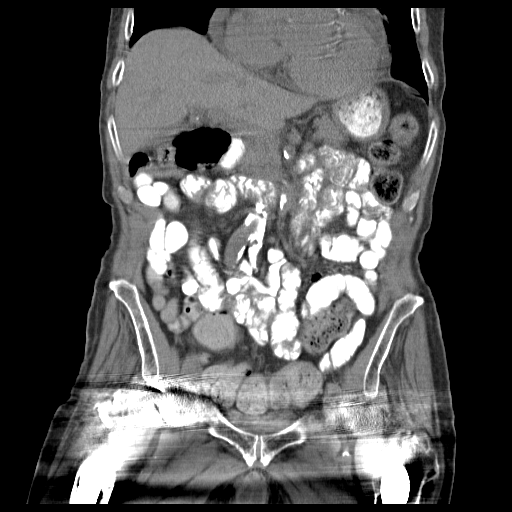
[im 47/85  soft-tissue]
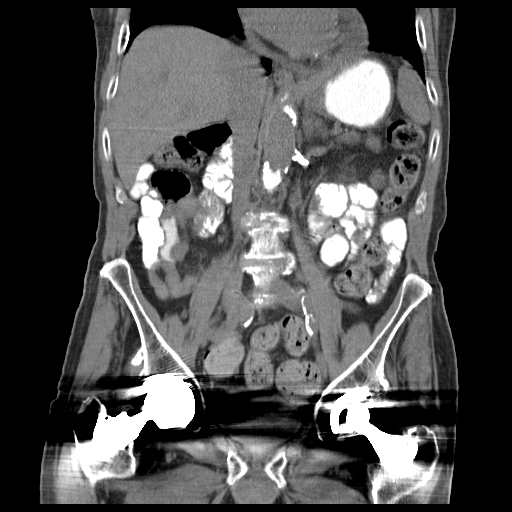

[16 of 46 positions shown; findings below may reference images not displayed]

FINDINGS: Lower chest: No pleural effusion. Pulmonary nodule in the left lower
lobe measures 1.4 cm. Previously 0.7 cm. Right middle lobe pulmonary
nodule mesh 0.8 cm, image 1 of series 4. Previously 0.4 cm. The
heart size is enlarged. There is thoracic aortic calcification.
Calcifications involving the LAD, left circumflex and RCA coronary
arteries also noted.

Hepatobiliary: No suspicious liver abnormality. The gallbladder is
normal. There is no biliary dilatation.

Pancreas: The pancreas is unremarkable.

Spleen: The spleen is negative.

Adrenals/Urinary Tract: Nodule in the right adrenal gland measures
1.3 cm and is unchanged from previous exam, image 18 of series 2.
New adjacent nodule measures 1.4 cm, image 60 of series 602. Stable
appearance of the nodular appearing left adrenal gland. Previous
right nephrectomy. Nonobstructing left renal calculi are identified
measuring up to 8 mm. There is a hyperdense lesion arising from the
inferior pole of the left kidney which measures 11/07/1936 GU and
1.4 cm, image 31 of series 2. Urinary bladder is largely obscured
secondary to beam hardening artifact from bilateral hip arthroplasty
devices.

Stomach/Bowel: The stomach is normal. The small bowel loops have a
normal course and caliber without obstruction. Normal appearance of
the colon.

Vascular/Lymphatic: Calcified atherosclerotic disease involves the
abdominal aorta. No aneurysm. No enlarged retroperitoneal or
mesenteric adenopathy. No enlarged pelvic or inguinal lymph nodes.

Reproductive: The prostate gland appears enlarged.

Other: No free fluid or fluid collections identified.

Musculoskeletal: The bones appear diffusely osteopenic. T12
compression deformity is noted with loss of approximately 30% of the
vertebral body height. This is new from previous exam.
IMPRESSION: 1. Pulmonary nodules in the right middle lobe and left lower lobe
have increased in size from the previous exam and are worrisome for
metastatic disease.
2. New nodule adjacent to the right adrenal gland is identified and
also suspicious for recurrence of tumor/metastatic disease.
3. Aortic atherosclerosis and multi vessel coronary artery
calcification.
4. Lumbar spondylosis with new T12 compression deformity.

## 2016-03-05 IMAGING — CR DG CHEST 2V
2 series · 2 of 2 positions shown · non-contrast
Comparison: 05/02/2015 and 11/11/2014

CLINICAL DATA: Shortness of breath today.

EXAM:
CHEST  2 VIEW

[w chest lat]
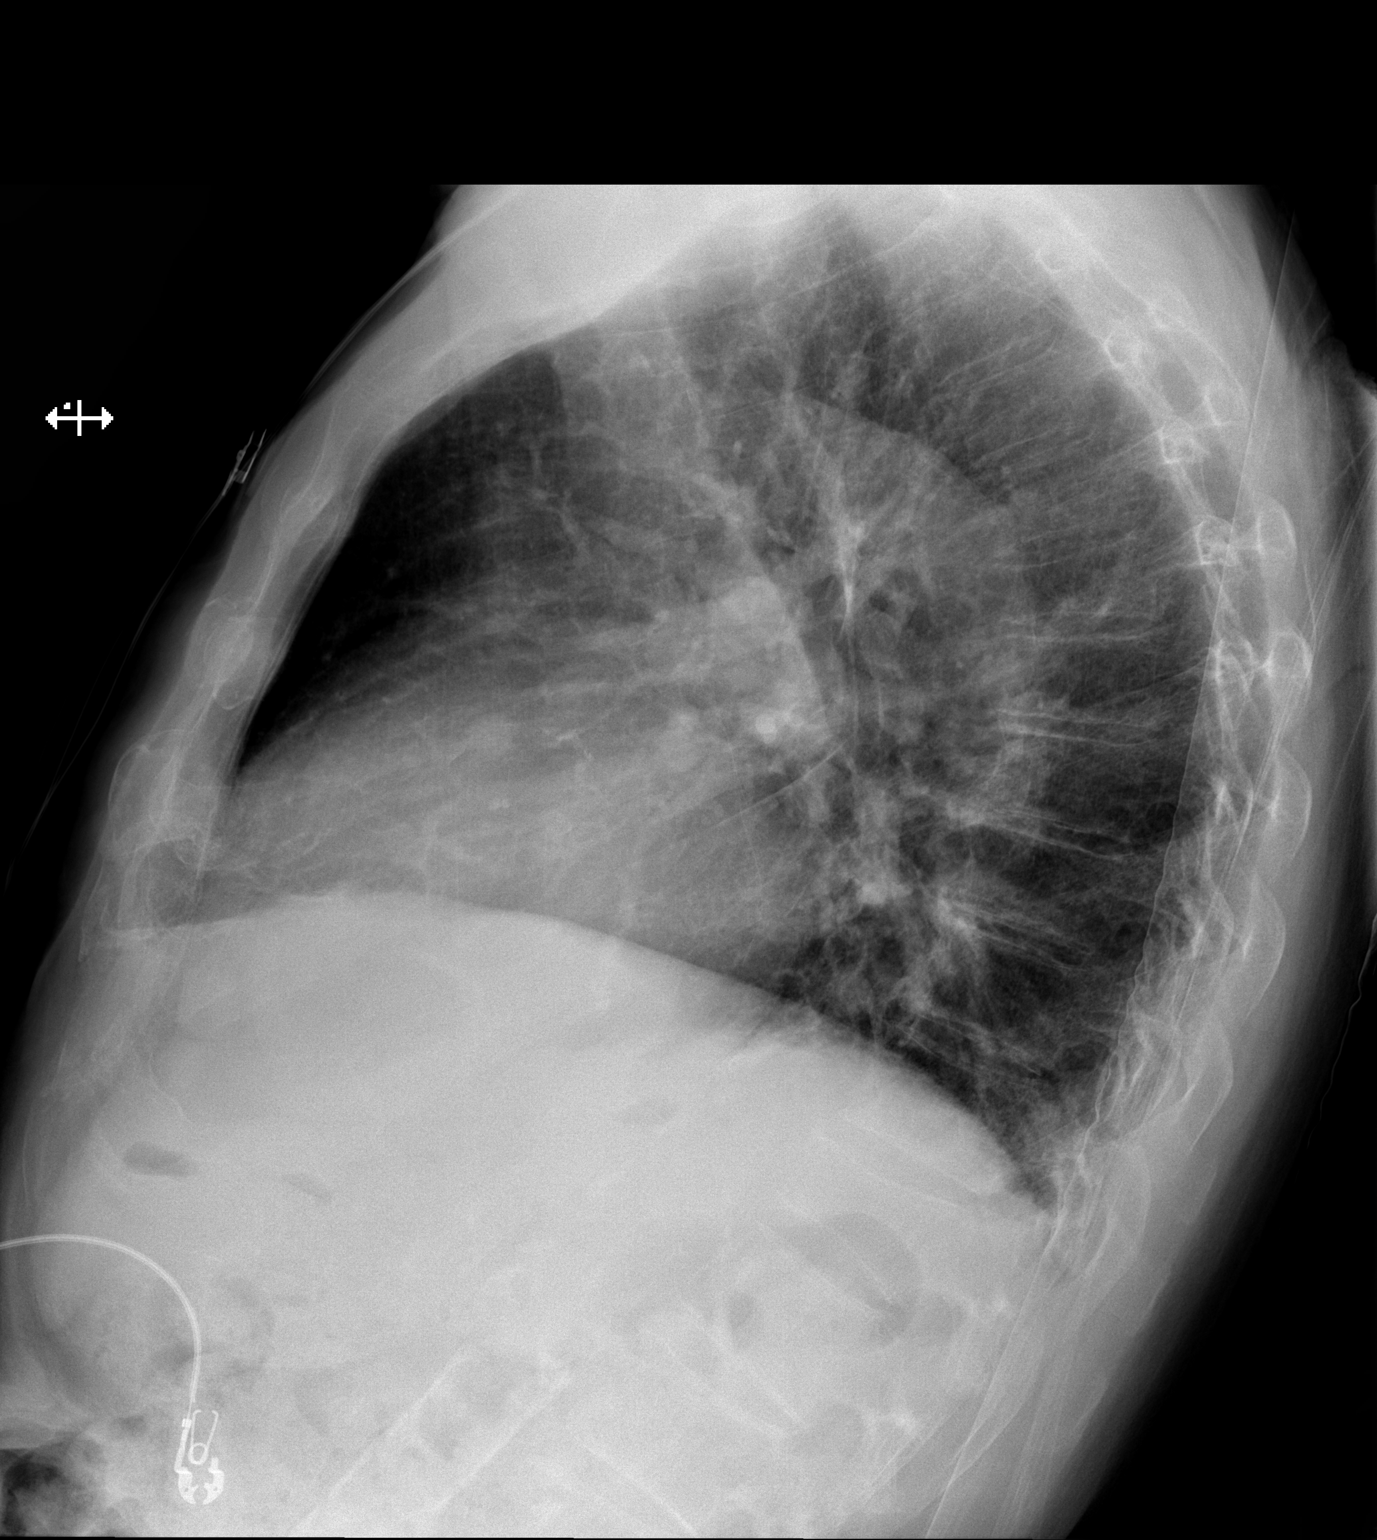

[x chest ap]
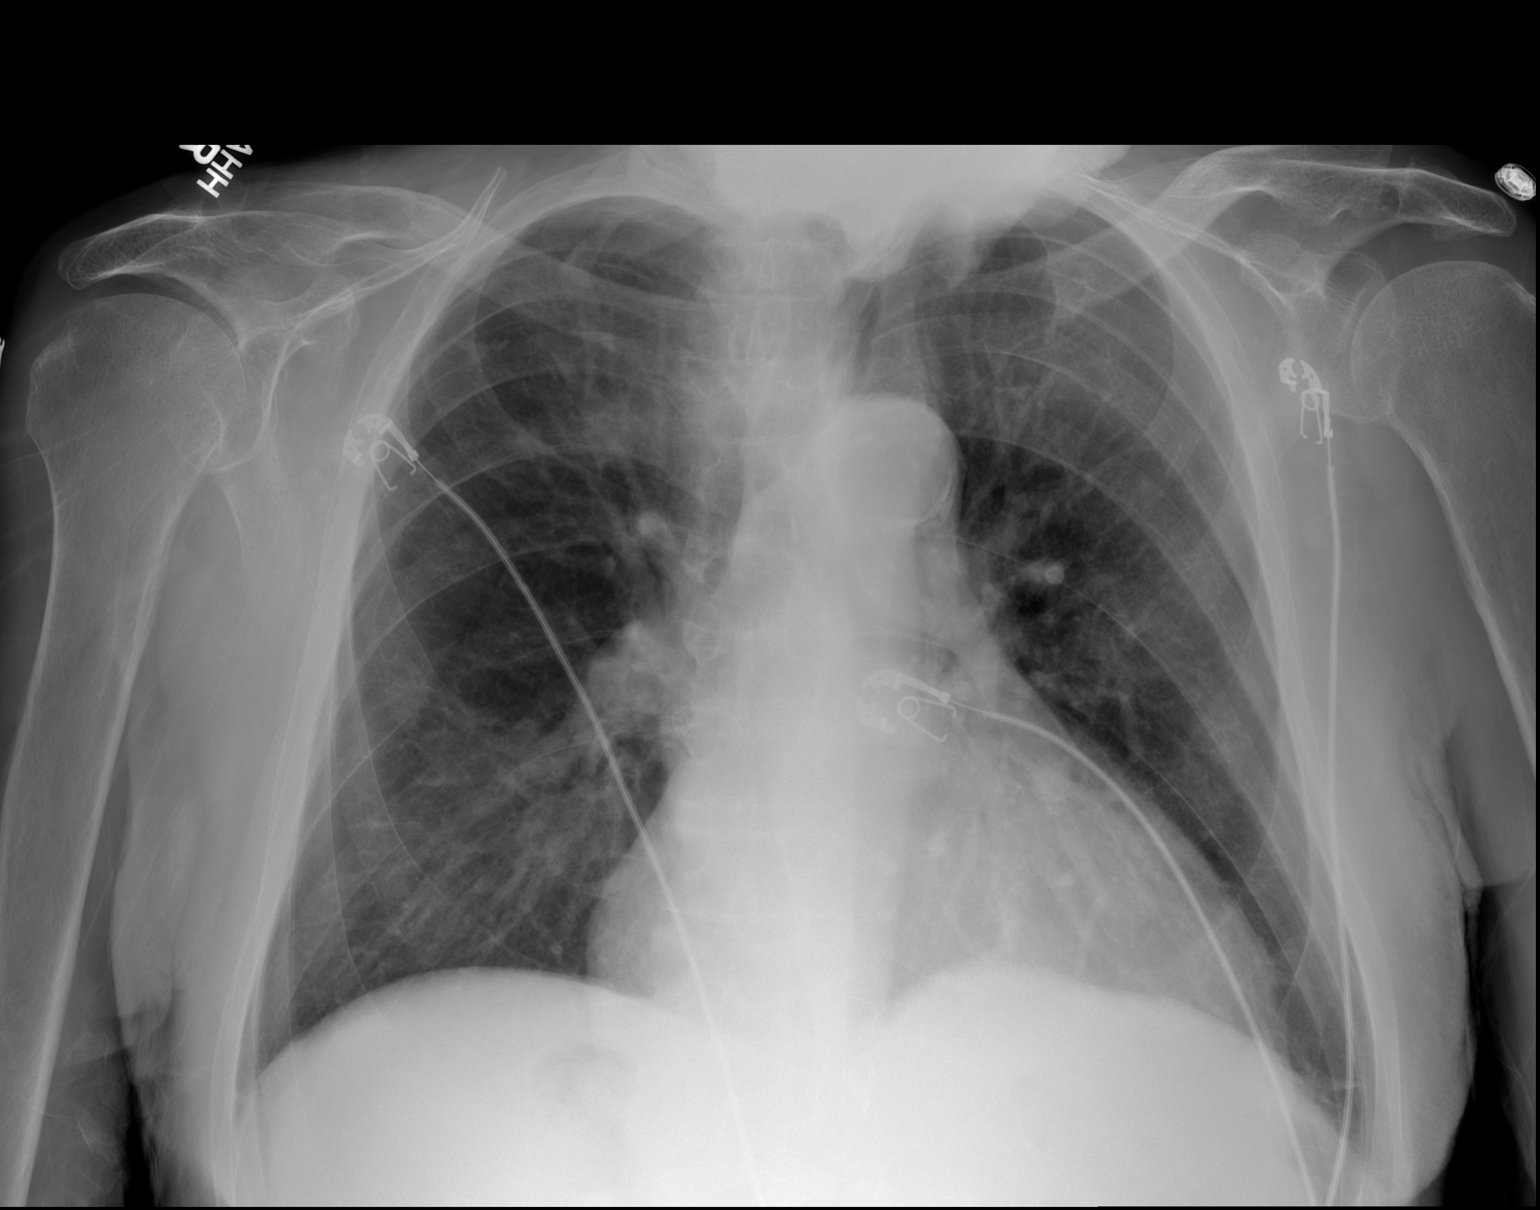

[2 of 2 positions shown; findings below may reference images not displayed]

FINDINGS: Lungs are adequately inflated without focal consolidation or
effusion. Mild stable cardiomegaly. Calcified plaque over the
thoracoabdominal aorta. Mild anterior wedging of a mid thoracic
vertebral body and vertebral body near the thoracolumbar junction
unchanged.
IMPRESSION: No active cardiopulmonary disease.

Mild cardiomegaly.

Stable anterior wedge compression deformities of the spine.
